# Patient Record
Sex: Female | Born: 1955 | Race: Black or African American | Hispanic: No | Smoking: Never smoker
Health system: Southern US, Community
[De-identification: ages and names within clinical notes are randomized; demographics above are authoritative.]

## PROBLEM LIST (undated history)

## (undated) DIAGNOSIS — D649 Anemia, unspecified: Secondary | ICD-10-CM

## (undated) DIAGNOSIS — K219 Gastro-esophageal reflux disease without esophagitis: Secondary | ICD-10-CM

## (undated) DIAGNOSIS — I10 Essential (primary) hypertension: Secondary | ICD-10-CM

## (undated) DIAGNOSIS — E785 Hyperlipidemia, unspecified: Secondary | ICD-10-CM

## (undated) DIAGNOSIS — E663 Overweight: Secondary | ICD-10-CM

## (undated) DIAGNOSIS — E119 Type 2 diabetes mellitus without complications: Secondary | ICD-10-CM

## (undated) DIAGNOSIS — M1990 Unspecified osteoarthritis, unspecified site: Secondary | ICD-10-CM

## (undated) DIAGNOSIS — M549 Dorsalgia, unspecified: Secondary | ICD-10-CM

## (undated) HISTORY — DX: Overweight: E66.3

## (undated) HISTORY — DX: Unspecified osteoarthritis, unspecified site: M19.90

## (undated) HISTORY — DX: Hyperlipidemia, unspecified: E78.5

## (undated) HISTORY — DX: Gastro-esophageal reflux disease without esophagitis: K21.9

## (undated) HISTORY — DX: Anemia, unspecified: D64.9

## (undated) HISTORY — DX: Essential (primary) hypertension: I10

## (undated) HISTORY — DX: Type 2 diabetes mellitus without complications: E11.9

## (undated) HISTORY — DX: Dorsalgia, unspecified: M54.9

## (undated) HISTORY — PX: BREAST BIOPSY: SHX20

---

## 1999-01-28 ENCOUNTER — Inpatient Hospital Stay (HOSPITAL_COMMUNITY): Admission: EM | Admit: 1999-01-28 | Discharge: 1999-01-29 | Payer: Self-pay

## 1999-01-28 ENCOUNTER — Encounter: Payer: Self-pay | Admitting: Emergency Medicine

## 2001-01-01 ENCOUNTER — Encounter: Payer: Self-pay | Admitting: Emergency Medicine

## 2001-01-01 ENCOUNTER — Emergency Department (HOSPITAL_COMMUNITY): Admission: EM | Admit: 2001-01-01 | Discharge: 2001-01-01 | Payer: Self-pay | Admitting: Emergency Medicine

## 2003-06-14 ENCOUNTER — Encounter: Admission: RE | Admit: 2003-06-14 | Discharge: 2003-09-12 | Payer: Self-pay | Admitting: *Deleted

## 2006-10-19 ENCOUNTER — Emergency Department (HOSPITAL_COMMUNITY): Admission: EM | Admit: 2006-10-19 | Discharge: 2006-10-19 | Payer: Self-pay | Admitting: Emergency Medicine

## 2010-01-13 LAB — HM MAMMOGRAPHY: HM Mammogram: NORMAL

## 2011-04-19 ENCOUNTER — Emergency Department (HOSPITAL_COMMUNITY)
Admission: EM | Admit: 2011-04-19 | Discharge: 2011-04-19 | Disposition: A | Discharge status: Home / Self Care | Payer: BC Managed Care – PPO | Attending: Emergency Medicine | Admitting: Emergency Medicine

## 2011-04-19 ENCOUNTER — Emergency Department (HOSPITAL_COMMUNITY): Payer: BC Managed Care – PPO

## 2011-04-19 DIAGNOSIS — M51379 Other intervertebral disc degeneration, lumbosacral region without mention of lumbar back pain or lower extremity pain: Secondary | ICD-10-CM | POA: Insufficient documentation

## 2011-04-19 DIAGNOSIS — R209 Unspecified disturbances of skin sensation: Secondary | ICD-10-CM | POA: Insufficient documentation

## 2011-04-19 DIAGNOSIS — M5137 Other intervertebral disc degeneration, lumbosacral region: Secondary | ICD-10-CM | POA: Insufficient documentation

## 2011-04-19 DIAGNOSIS — M5430 Sciatica, unspecified side: Secondary | ICD-10-CM | POA: Insufficient documentation

## 2011-04-19 DIAGNOSIS — M545 Low back pain, unspecified: Secondary | ICD-10-CM | POA: Insufficient documentation

## 2011-08-10 ENCOUNTER — Encounter: Payer: Self-pay | Admitting: Internal Medicine

## 2011-08-10 ENCOUNTER — Ambulatory Visit (INDEPENDENT_AMBULATORY_CARE_PROVIDER_SITE_OTHER): Payer: BC Managed Care – PPO | Admitting: Internal Medicine

## 2011-08-10 ENCOUNTER — Other Ambulatory Visit: Payer: Self-pay | Admitting: Internal Medicine

## 2011-08-10 ENCOUNTER — Other Ambulatory Visit (INDEPENDENT_AMBULATORY_CARE_PROVIDER_SITE_OTHER): Payer: BC Managed Care – PPO

## 2011-08-10 VITALS — BP 140/90 | HR 76 | Temp 99.0°F | Ht 66.0 in | Wt 185.8 lb

## 2011-08-10 DIAGNOSIS — Z1239 Encounter for other screening for malignant neoplasm of breast: Secondary | ICD-10-CM

## 2011-08-10 DIAGNOSIS — Z0000 Encounter for general adult medical examination without abnormal findings: Secondary | ICD-10-CM

## 2011-08-10 DIAGNOSIS — M549 Dorsalgia, unspecified: Secondary | ICD-10-CM

## 2011-08-10 DIAGNOSIS — Z124 Encounter for screening for malignant neoplasm of cervix: Secondary | ICD-10-CM

## 2011-08-10 LAB — BASIC METABOLIC PANEL
BUN: 12 mg/dL (ref 6–23)
CO2: 26 mEq/L (ref 19–32)
Calcium: 9.7 mg/dL (ref 8.4–10.5)
GFR: 87.98 mL/min (ref >60.00)
Glucose, Bld: 104 mg/dL — ABNORMAL HIGH (ref 70–99)

## 2011-08-10 LAB — URINALYSIS, ROUTINE W REFLEX MICROSCOPIC
Bilirubin Urine: NEGATIVE
Ketones, ur: NEGATIVE
Nitrite: NEGATIVE
Urobilinogen, UA: 0.2 (ref 0.0–1.0)
pH: 6 (ref 5.0–8.0)

## 2011-08-10 LAB — CBC WITH DIFFERENTIAL/PLATELET
Basophils Absolute: 0 10*3/uL (ref 0.0–0.1)
Eosinophils Absolute: 0.1 10*3/uL (ref 0.0–0.7)
Lymphocytes Relative: 36.7 % (ref 12.0–46.0)
Lymphs Abs: 1.8 10*3/uL (ref 0.7–4.0)
MCHC: 33.6 g/dL (ref 30.0–36.0)
Monocytes Relative: 5 % (ref 3.0–12.0)
Neutro Abs: 2.7 10*3/uL (ref 1.4–7.7)
Platelets: 224 10*3/uL (ref 150.0–400.0)
RDW: 14.2 % (ref 11.5–14.6)

## 2011-08-10 LAB — LIPID PANEL: Total CHOL/HDL Ratio: 4

## 2011-08-10 LAB — HEPATIC FUNCTION PANEL
ALT: 21 U/L (ref 0–35)
AST: 24 U/L (ref 0–37)
Alkaline Phosphatase: 65 U/L (ref 39–117)
Bilirubin, Direct: 0.1 mg/dL (ref 0.0–0.3)
Total Bilirubin: 0.6 mg/dL (ref 0.3–1.2)

## 2011-08-10 NOTE — Patient Instructions (Addendum)
It was good to see you today. We have reviewed your prior medical history today - Will send for records from Dr. Derrell Lolling to review and incorporate into our files here we'll make referral to physical therapy for your back pain, mammogram screening and to gynecology for Pap smear and hot flashes. Our office will contact you regarding appointment(s) once made. Test(s) ordered today. Your results will be called to you after review (48-72hours after test completion). If any changes need to be made, you will be notified at that time. Remember to eat enough calories during the day -you need at least 1500 kcal each day and these calories should be spread out throughout the day with protein snacks and other small meals as we discussed Please schedule followup in 6 months for continued review and followup, call sooner if problems.

## 2011-08-10 NOTE — Assessment & Plan Note (Signed)
Intermittent hx same since 2008 Has worked with [facility] ortho for same - also ESI x 1 fall 2012 but declines repeat ESI due to feared complication risk (infx) No neuro deficits on exam - continue current prn meds and refer to PT today

## 2011-08-10 NOTE — Progress Notes (Signed)
Subjective:    Patient ID: Alexis Shields, female    DOB: [birth year], 55 y.o.   MRN: [id number]  HPI  New patient to me and our practice, here today to establish care patient is here today for annual physical. Patient feels well and has no complaints.  Also reviewed chronic medical issues: Intermittent low back pain - left side with radiation into LLE - s/p single ESI 05/2011 - became fearful of complications (national infections due to steroid contamination) so declined follow up injections - has seen ortho ([facility]) and takes listed meds rarely prn - remote PT 1990s following workman's comp injury- would reconsider same  Past Medical History  Diagnosis Date  . Back pain   . Anemia   . Borderline diabetes 2009   Family History  Problem Relation Age of Onset  . Kidney failure Mother   . Alcohol abuse Father   . Liver disease Father     History  Substance Use Topics  . Smoking status: Never Smoker   . Smokeless tobacco: Not on file  . Alcohol Use: Yes    Review of Systems Constitutional: Negative for fever - ongoing weight gain since menopause status in past few years.  Respiratory: Negative for cough and shortness of breath.   Cardiovascular: Negative for chest pain or palpitations.  Gastrointestinal: Negative for abdominal pain, no bowel changes.  Musculoskeletal: Negative for gait problem or joint swelling.  Skin: Negative for rash.  Neurological: Negative for dizziness or headache.  No other specific complaints in a complete review of systems (except as listed in HPI above).     Objective:   Physical Exam Blood pressure 140/90, pulse 76, temperature 99 F (37.2 C), temperature source Oral, height 5\' 6"  (1.676 m), weight 185 lb 12.8 oz (84.278 kg), SpO2 97.00%. Wt Readings from Last 3 Encounters:  08/10/11 185 lb 12.8 oz (84.278 kg)   Constitutional: She appears well-developed and well-nourished. No distress.  HENT: Head: Normocephalic and atraumatic. Ears: B  TMs ok, no erythema or effusion; Nose: Nose normal.  Mouth/Throat: Oropharynx is clear and moist. No oropharyngeal exudate.  Eyes: Conjunctivae and EOM are normal. Pupils are equal, round, and reactive to light. No scleral icterus.  Neck: Normal range of motion. Neck supple. No JVD present. No thyromegaly present.  Cardiovascular: Normal rate, regular rhythm and normal heart sounds.  No murmur heard. No BLE edema. Pulmonary/Chest: Effort normal and breath sounds normal. No respiratory distress. She has no wheezes.  Abdominal: Soft. Bowel sounds are normal. She exhibits no distension. There is no tenderness. no masses Musculoskeletal: Back: full range of motion of thoracic and lumbar spine. Non tender to palpation. Negative straight leg raise. DTR's are symmetrically intact. Sensation intact in all dermatomes of the lower extremities. Full strength to manual muscle testing. patient is able to heel toe walk without difficulty and ambulates with normal gait. Normal range of motion, no joint effusions. No gross deformities Neurological: She is alert and oriented to person, place, and time. No cranial nerve deficit. Coordination normal.  Skin: Skin is warm and dry. No rash noted. No erythema.  Psychiatric: She has a normal mood and affect. Her behavior is normal. Judgment and thought content normal.   No results found for this basename: WBC,  HGB,  HCT,  PLT,  GLUCOSE,  CHOL,  TRIG,  HDL,  LDLDIRECT,  LDLCALC,  ALT,  AST,  NA,  K,  CL,  CREATININE,  BUN,  CO2,  TSH,  PSA,  INR,  GLUF,  HGBA1C,  MICROALBUR       Assessment & Plan:  CPX - v70.0 - Patient has been counseled on age-appropriate routine health concerns for screening and prevention. These are reviewed and up-to-date. Immunizations are up-to-date or declined. Labs ordered and will be reviewed. Send for prior records to confirm colonoscopy and immunization status. Refer for mammogram and GYN exam  Also See problem list. Medications and labs  reviewed today.

## 2011-08-11 ENCOUNTER — Other Ambulatory Visit: Payer: Self-pay | Admitting: Internal Medicine

## 2011-08-11 DIAGNOSIS — E669 Obesity, unspecified: Secondary | ICD-10-CM

## 2011-08-24 ENCOUNTER — Encounter: Payer: Self-pay | Admitting: *Deleted

## 2011-08-24 ENCOUNTER — Encounter: Payer: BC Managed Care – PPO | Attending: Internal Medicine | Admitting: *Deleted

## 2011-08-24 DIAGNOSIS — E669 Obesity, unspecified: Secondary | ICD-10-CM | POA: Insufficient documentation

## 2011-08-24 DIAGNOSIS — Z713 Dietary counseling and surveillance: Secondary | ICD-10-CM | POA: Insufficient documentation

## 2011-08-24 NOTE — Patient Instructions (Addendum)
Goals:  Count carbohydrates at meals as discussed.   Eat 3 meals/day + 1 snack (if desired) - Avoid meal skipping.   Choose more whole grains, lean protein, low-fat dairy (as tolerated), and fruits/non-starchy vegetables.   Increase protein rich foods at all meals and snacks.  Limit carbohydrate to 3 servings (45 grams) per meal.   Aim for 30 min of physical activity 2-3 days a week. Gradually increase to 45 min as able.   Avoid sugar-sweetened beverages and concentrated sweets; (try the 7.5 oz cans of Mr Wyn Quaker or 1/2 and 1/2)

## 2011-08-24 NOTE — Progress Notes (Signed)
Medical Nutrition Therapy:  Appt start time: 1100 end time:  1200.  Assessment:  Obesity/overweight. Pt here for assessment and nutritional counseling for "bad eating habits". Pt works as a Advertising copywriter, skips breakfast often, and drinks regular soft drinks or coffee with ~10 packs of sugar daily.  Consumes 3 meals some days and then "can't stop snacking" the next day.  Reports eating more when husband is in town and she cooks at home, though excessive CHO intake noted.  Pt reports a h/o anorexia nervosa with episodes of hypoglycemia prior to onset of menopause; exact timeframe unclear.  States she has gone 2 days without eating to lose weight and is very frustrated with lack of success.  No structured exercise noted outside of a 5 mile walk every other Tuesday and PT for back 2d/wk.  Agrees she could try adding walks in on her days off (Mon and Fri) or will look into joining to [facility] and swim.  No pain reported at this time.  MEDICATIONS: None   DIETARY INTAKE:  Usual eating pattern includes 0-3 meals and 0 snacks per day.    24-hr recall:  B ( AM): none; 1 c coffee or Mtn Dew  Snk ( AM): none  L (2 PM): Bologna w/ pepper jack cheese sandwich; pepsi (12 oz)  Snk ( PM): bottle water D ( PM): Ribs (2-3 bites of each), creamed potatoes (2 Tbsp); water and coffee (w/ 10 pks reg sugar) Snk ( PM): none  Usual physical activity: 5 miles every other Tuesday; PT for back pain 2d/wk  Estimated energy needs: 1100-1200 calories (maintenance: 1450 cal/day with no PA) 135-150 g carbohydrates  Progress Towards Goal(s):  In progress.   Nutritional Diagnosis:  [place]-3.3 Overweight related to menopause and excessive CHO intake as evidenced by patient reported weight gain of 40 lbs since onset of menopause 3 years ago and reported CHO intake.  Intervention/Goals:  Count carbohydrates at meals as discussed.   Eat 3 meals/day + 1 snack (if desired) - Avoid meal skipping.   Choose more whole grains, lean  protein, low-fat dairy (as tolerated), and fruits/non-starchy vegetables.   Increase protein rich foods at all meals and snacks.  Limit carbohydrate to 3 servings (45 grams) per meal.   Aim for 30 min of physical activity 2-3 days a week. Gradually increase to 45 min as able.   Avoid sugar-sweetened beverages and concentrated sweets; (try the 7.5 oz cans of Mr Wyn Quaker or 1/2 and 1/2)   Handouts given during visit include:  [employer] Guide  45g CHO menu/snack list  Monitoring/Evaluation:  Dietary intake, exercise, and body weight in 4 week(s).

## 2011-08-28 ENCOUNTER — Ambulatory Visit
Admission: RE | Admit: 2011-08-28 | Discharge: 2011-08-28 | Disposition: A | Discharge status: Home / Self Care | Payer: BC Managed Care – PPO | Source: Ambulatory Visit | Attending: Internal Medicine | Admitting: Internal Medicine

## 2011-08-28 DIAGNOSIS — Z1239 Encounter for other screening for malignant neoplasm of breast: Secondary | ICD-10-CM

## 2011-09-10 ENCOUNTER — Other Ambulatory Visit: Payer: Self-pay | Admitting: Internal Medicine

## 2011-09-10 DIAGNOSIS — R928 Other abnormal and inconclusive findings on diagnostic imaging of breast: Secondary | ICD-10-CM

## 2011-09-17 ENCOUNTER — Ambulatory Visit
Admission: RE | Admit: 2011-09-17 | Discharge: 2011-09-17 | Disposition: A | Discharge status: Home / Self Care | Payer: BC Managed Care – PPO | Source: Ambulatory Visit | Attending: Internal Medicine | Admitting: Internal Medicine

## 2011-09-17 DIAGNOSIS — R928 Other abnormal and inconclusive findings on diagnostic imaging of breast: Secondary | ICD-10-CM

## 2011-09-25 ENCOUNTER — Ambulatory Visit: Payer: BC Managed Care – PPO | Admitting: *Deleted

## 2011-09-28 ENCOUNTER — Encounter: Payer: BC Managed Care – PPO | Attending: Internal Medicine | Admitting: *Deleted

## 2011-09-28 ENCOUNTER — Encounter: Payer: Self-pay | Admitting: *Deleted

## 2011-09-28 DIAGNOSIS — E669 Obesity, unspecified: Secondary | ICD-10-CM | POA: Insufficient documentation

## 2011-09-28 DIAGNOSIS — Z713 Dietary counseling and surveillance: Secondary | ICD-10-CM | POA: Insufficient documentation

## 2011-09-28 NOTE — Patient Instructions (Addendum)
Goals:  Eat 3 meals/day + 1 snack (if desired) - Avoid meal skipping. Try to retrain your appetite with supplemental drinks (only short term).  Continue to count carbohydrates at meals as discussed (45 grams per meal).  Aim for 30 min of physical activity 2-3 days a week. Gradually increase to 45 min as able.   Continue to avoid sugar-sweetened beverages and concentrated sweets.  Consider trying [employer] as recommended by Ob-Gyn.   [facility] - [phone], [place]

## 2011-09-28 NOTE — Progress Notes (Signed)
Medical Nutrition Therapy:  Appt start time: 10:30 end time:  1100.  Primary Concerns Today: Obesity/overweight - Follow up. Pt here for follow up.  States she gained 40 lbs over the last 3 years.  Was more active previously.  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  Eats boiled egg or nuts with drink. Still drinking one cup of coffee daily with regular sugar and creamer. Reports eating less than previous visit in attempt to lose weight. Discussed eating consistent meals and carbs to prevent progression of DM and to lose weight. Pt not very happy about having to eat more. No pain reported at this time.  MEDICATIONS:  Only PRN meds when needed.   DIETARY INTAKE:  Usual eating pattern includes 1-2 meals and 0 snacks per day.    24-hr recall:  B ( AM): Nuts or 1 c coffee or Mtn Dew (7.5 oz)  Snk ( AM): None  L (2 PM): Chicken breast (2-3 oz) w/ cheese; stewed okra, corn, tomatoes, turnips, steamed squash; Coke (12 oz)  Snk ( PM): bottle water D (6 PM): 4 strawberries, 6 grapes, and 1 banana Snk ( PM): none  Usual physical activity: None at this time  Estimated energy needs: 1100-1200 calories (maintenance: 1450 cal/day with no PA) 135-150 g carbohydrates  Progress Towards Goal(s):  Some progress; Continue.   Nutritional Diagnosis:  [place]-3.3 Overweight related to menopause and excessive CHO intake as evidenced by patient reported weight gain of 40 lbs since onset of menopause 3 years ago and reported dietary intake.  Intervention/Goals:  Continue to count carbohydrates at meals as discussed (45 grams per meal).  Eat 3 meals/day + 1 snack (if desired) - Avoid meal skipping.   Choose more whole grains, lean protein, low-fat dairy (as tolerated), and fruits/non-starchy vegetables.   Aim for 30 min of physical activity 2-3 days a week. Gradually increase to 45 min as able.   Continue to avoid sugar-sweetened beverages and concentrated  sweets.  Consider trying [facility] as recommended by Ob-Gyn.   Samples Dispensed:   CIB NAS: 2 pkts Lot #: [id number]; Exp: 03/03/12  CIB Reg: 2 pkts Lot #: [id number]; Exp: 04/20/12  Boost Reg: 2 bottles Lot #: [id number]; Exp: 07/11/12  Monitoring/Evaluation:  Dietary intake, exercise, and body weight in 4 week(s).

## 2011-09-29 NOTE — Progress Notes (Signed)
Medical Nutrition Therapy:  Appt start time: 1100 end time:  1200.  Assessment:  Obesity/overweight. Pt here for assessment and nutritional counseling for "bad eating habits". Pt works as a housekeeper, skips breakfast often, and drinks regular soft drinks or coffee with ~10 packs of sugar daily.  Consumes 3 meals some days and then "can't stop snacking" the next day.  Reports eating more when husband is in town and she cooks at home, though excessive CHO intake noted.  Pt reports a h/o anorexia nervosa with episodes of hypoglycemia prior to onset of menopause; exact timeframe unclear.  States she has gone 2 days without eating to lose weight and is very frustrated with lack of success.  No structured exercise noted outside of a 5 mile walk every other Tuesday and PT for back 2d/wk.  Agrees she could try adding walks in on her days off (Mon and Fri) or will look into joining to [employer] and swim.  No pain reported at this time.  MEDICATIONS: None   DIETARY INTAKE:  Usual eating pattern includes 0-3 meals and 0 snacks per day.    24-hr recall:  B ( AM): none; 1 c coffee or Mtn Dew  Snk ( AM): none  L (2 PM): Bologna w/ pepper jack cheese sandwich; pepsi (12 oz)  Snk ( PM): bottle water D ( PM): Ribs (2-3 bites of each), creamed potatoes (2 Tbsp); water and coffee (w/ 10 pks reg sugar) Snk ( PM): none  Usual physical activity: 5 miles every other Tuesday; PT for back pain 2d/wk  Estimated energy needs: 1100-1200 calories (maintenance: 1450 cal/day with no PA) 135-150 g carbohydrates  Progress Towards Goal(s):  In progress.   Nutritional Diagnosis:  [place]-3.3 Overweight related to menopause and excessive CHO intake as evidenced by patient reported weight gain of 40 lbs since onset of menopause 3 years ago and reported CHO intake.  Intervention/Goals:  Count carbohydrates at meals as discussed.   Eat 3 meals/day + 1 snack (if desired) - Avoid meal skipping.   Choose more whole grains, lean  protein, low-fat dairy (as tolerated), and fruits/non-starchy vegetables.   Increase protein rich foods at all meals and snacks.  Limit carbohydrate to 3 servings (45 grams) per meal.   Aim for 30 min of physical activity 2-3 days a week. Gradually increase to 45 min as able.   Avoid sugar-sweetened beverages and concentrated sweets; (try the 7.5 oz cans of Mr Dew or 1/2 and 1/2)   Handouts given during visit include:  Supermarket Guide  45g CHO menu/snack list  Monitoring/Evaluation:  Dietary intake, exercise, and body weight in 4 week(s).  

## 2011-10-26 ENCOUNTER — Ambulatory Visit: Payer: BC Managed Care – PPO | Admitting: *Deleted

## 2012-02-08 ENCOUNTER — Ambulatory Visit: Payer: BC Managed Care – PPO | Admitting: Internal Medicine

## 2012-02-22 ENCOUNTER — Ambulatory Visit (INDEPENDENT_AMBULATORY_CARE_PROVIDER_SITE_OTHER): Payer: BC Managed Care – PPO | Admitting: Internal Medicine

## 2012-02-22 ENCOUNTER — Telehealth: Payer: Self-pay | Admitting: *Deleted

## 2012-02-22 ENCOUNTER — Encounter: Payer: Self-pay | Admitting: Internal Medicine

## 2012-02-22 VITALS — BP 118/82 | HR 70 | Temp 98.6°F | Ht 66.0 in | Wt 184.0 lb

## 2012-02-22 DIAGNOSIS — R7303 Prediabetes: Secondary | ICD-10-CM

## 2012-02-22 DIAGNOSIS — R7309 Other abnormal glucose: Secondary | ICD-10-CM

## 2012-02-22 DIAGNOSIS — Z6825 Body mass index (BMI) 25.0-25.9, adult: Secondary | ICD-10-CM

## 2012-02-22 DIAGNOSIS — K219 Gastro-esophageal reflux disease without esophagitis: Secondary | ICD-10-CM | POA: Insufficient documentation

## 2012-02-22 DIAGNOSIS — E119 Type 2 diabetes mellitus without complications: Secondary | ICD-10-CM | POA: Insufficient documentation

## 2012-02-22 DIAGNOSIS — Z0000 Encounter for general adult medical examination without abnormal findings: Secondary | ICD-10-CM

## 2012-02-22 DIAGNOSIS — E663 Overweight: Secondary | ICD-10-CM | POA: Insufficient documentation

## 2012-02-22 DIAGNOSIS — N951 Menopausal and female climacteric states: Secondary | ICD-10-CM

## 2012-02-22 MED ORDER — PHENTERMINE HCL 37.5 MG PO CAPS: 37.5000 mg | ORAL_CAPSULE | ORAL | Status: DC

## 2012-02-22 MED ORDER — BLACK COHOSH 40 MG PO CAPS: 40.0000 mg | ORAL_CAPSULE | Freq: Two times a day (BID) | ORAL | Status: DC

## 2012-02-22 NOTE — Assessment & Plan Note (Signed)
No results found for this basename: HGBA1C   Dx same 2009 -  The patient is asked to make an attempt to improve diet and exercise patterns to aid in medical management of this problem. Check labs annually  

## 2012-02-22 NOTE — Telephone Encounter (Signed)
Received staff msg pt mad cpx for December need labs in epic... 02/22/12@12 :10pm/LMB

## 2012-02-22 NOTE — Assessment & Plan Note (Signed)
S/p gyn eval 12/12 for same Reluctant to take meds for same recommended otc herbals - pt still unsure Support offered

## 2012-02-22 NOTE — Assessment & Plan Note (Signed)
Wt Readings from Last 3 Encounters:  02/22/12 184 lb (83.462 kg)  09/28/11 186 lb 14.4 oz (84.777 kg)  08/24/11 185 lb 9.6 oz (84.188 kg)   S/p nutritionist eval 12/12, 1/13 - frustrated by inability to change weight Trial phentermine - rx done

## 2012-02-22 NOTE — Telephone Encounter (Signed)
Message copied by Deatra James on Mon Feb 22, 2012 12:08 PM ------      Message from: COUSIN, SHARON T      Created: Mon Feb 22, 2012 11:16 AM      Regarding: PHY DATE  08/25/12       THANKS

## 2012-02-22 NOTE — Progress Notes (Signed)
  Subjective:    Patient ID: Alexis Shields, female    DOB: September 03, 1956, 56 y.o.   MRN: [id number]  HPI  Here for follow up -  reviewed chronic medical issues:  Obesity - has meet with nutritionist spring 2013 - still frustrated by inability to lose weight -  Intermittent low back pain - left side with radiation into LLE - s/p single ESI 05/2011 - became fearful of complications (national infections due to steroid contamination) so declined follow up injections - has seen ortho ([facility]) and takes listed meds rarely prn - remote PT 1990s following workman's comp injury, s/p repeat PT 08/2011 and feels improved  Hot flashes - has been seen by gyn for same - pt reluctant to take meds for same - afraid of side effects    Past Medical History  Diagnosis Date  . Back pain   . Anemia   . Borderline diabetes 2009  . Anorexia nervosa     Reported by patient at 08/24/11 visit  . Overweight (BMI 25.0-29.9)   . GERD (gastroesophageal reflux disease)     Review of Systems  Constitutional: Negative for fever - fustrated by weight gain since menopause status in past few years.  Respiratory: Negative for cough and shortness of breath.   Cardiovascular: Negative for chest pain or palpitations.       Objective:   Physical Exam  Blood pressure 118/82, pulse 70, temperature 98.6 F (37 C), temperature source Oral, height 5\' 6"  (1.676 m), weight 184 lb (83.462 kg), SpO2 96.00%. Wt Readings from Last 3 Encounters:  02/22/12 184 lb (83.462 kg)  09/28/11 186 lb 14.4 oz (84.777 kg)  08/24/11 185 lb 9.6 oz (84.188 kg)   Constitutional: She is overweight, but appears well-developed and well-nourished. No distress.  Neck: Normal range of motion. Neck supple. No JVD present. No thyromegaly present.  Cardiovascular: Normal rate, regular rhythm and normal heart sounds.  No murmur heard. No BLE edema. Pulmonary/Chest: Effort normal and breath sounds normal. No respiratory distress. She has no  wheezes.  Neurological: She is alert and oriented to person, place, and time. No cranial nerve deficit. Coordination normal.   Psychiatric: She has a normal mood and affect. Her behavior is normal. Judgment and thought content normal.   Lab Results  Component Value Date   WBC 4.8 08/10/2011   HGB 12.6 08/10/2011   HCT 37.5 08/10/2011   PLT 224.0 08/10/2011   GLUCOSE 104* 08/10/2011   CHOL 215* 08/10/2011   TRIG 44.0 08/10/2011   HDL 60.70 08/10/2011   LDLDIRECT 143.9 08/10/2011   ALT 21 08/10/2011   AST 24 08/10/2011   NA 142 08/10/2011   K 4.5 08/10/2011   CL 107 08/10/2011   CREATININE 0.9 08/10/2011   BUN 12 08/10/2011   CO2 26 08/10/2011   TSH 1.18 08/10/2011       Assessment & Plan:   See problem list. Medications and labs reviewed today.

## 2012-02-22 NOTE — Patient Instructions (Signed)
It was good to see you today. We have reviewed your prior medical history today - Try phentermine for appetite suppression - Your prescription(s) have been given to you to submit to your pharmacy. Please take as directed and contact our office if you believe you are having problem(s) with the medication(s). Please schedule followup in 6 months for physical and labs, call sooner if problems.

## 2012-05-16 ENCOUNTER — Encounter (HOSPITAL_COMMUNITY): Payer: Self-pay

## 2012-05-16 ENCOUNTER — Emergency Department (HOSPITAL_COMMUNITY): Payer: BC Managed Care – PPO

## 2012-05-16 ENCOUNTER — Emergency Department (HOSPITAL_COMMUNITY)
Admission: EM | Admit: 2012-05-16 | Discharge: 2012-05-17 | Disposition: A | Discharge status: Home / Self Care | Payer: BC Managed Care – PPO | Attending: Emergency Medicine | Admitting: Emergency Medicine

## 2012-05-16 DIAGNOSIS — K219 Gastro-esophageal reflux disease without esophagitis: Secondary | ICD-10-CM | POA: Insufficient documentation

## 2012-05-16 DIAGNOSIS — R51 Headache: Secondary | ICD-10-CM

## 2012-05-16 MED ORDER — SODIUM CHLORIDE 0.9 % IV BOLUS (SEPSIS)
1000.0000 mL | Freq: Once | INTRAVENOUS | Status: AC
  Administered 2012-05-16: 1000 mL via INTRAVENOUS

## 2012-05-16 MED ORDER — DIPHENHYDRAMINE HCL 50 MG/ML IJ SOLN
25.0000 mg | Freq: Once | INTRAMUSCULAR | Status: AC
  Administered 2012-05-16: 25 mg via INTRAVENOUS
  Filled 2012-05-16: qty 1

## 2012-05-16 MED ORDER — METOCLOPRAMIDE HCL 5 MG/ML IJ SOLN
10.0000 mg | Freq: Once | INTRAMUSCULAR | Status: AC
  Administered 2012-05-16: 10 mg via INTRAVENOUS
  Filled 2012-05-16: qty 2
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
## 2012-05-16 NOTE — ED Notes (Signed)
Pt states she awoke with a headache in both temples and the back of her head yesterday.  States she took a BC powder with relief for 2 hours-states the headache returned later in the day-states she awoke with it again this AM-took Baby aspirin x 2 tabs at 1600 this evening with no relief of pain.  States she saw her primary in June and states they checked her BP, but did not say anything about her BP being elevated.  States she is sound and light sensitive.

## 2012-05-16 NOTE — ED Notes (Signed)
PA at bedside.

## 2012-05-17 LAB — CBC WITH DIFFERENTIAL/PLATELET
Eosinophils Relative: 2 % (ref 0–5)
HCT: 37 % (ref 36.0–46.0)
Lymphocytes Relative: 42 % (ref 12–46)
Lymphs Abs: 2.1 10*3/uL (ref 0.7–4.0)
MCV: 80.1 fL (ref 78.0–100.0)
Monocytes Absolute: 0.4 10*3/uL (ref 0.1–1.0)
Platelets: 239 10*3/uL (ref 150–400)
RBC: 4.62 MIL/uL (ref 3.87–5.11)
WBC: 5 10*3/uL (ref 4.0–10.5)

## 2012-05-17 LAB — POCT I-STAT, CHEM 8
BUN: 9 mg/dL (ref 6–23)
Chloride: 106 mEq/L (ref 96–112)
Creatinine, Ser: 0.8 mg/dL (ref 0.50–1.10)
Potassium: 3.7 mEq/L (ref 3.5–5.1)
Sodium: 139 mEq/L (ref 135–145)
TCO2: 22 mmol/L (ref 0–100)

## 2012-05-17 LAB — BASIC METABOLIC PANEL
CO2: 24 mEq/L (ref 19–32)
Calcium: 9.6 mg/dL (ref 8.4–10.5)
Glucose, Bld: 109 mg/dL — ABNORMAL HIGH (ref 70–99)
Sodium: 136 mEq/L (ref 135–145)

## 2012-05-17 MED ORDER — HYDROCODONE-ACETAMINOPHEN 5-500 MG PO TABS: 1.0000 | ORAL_TABLET | Freq: Four times a day (QID) | ORAL | Status: DC | PRN

## 2012-05-17 NOTE — ED Provider Notes (Signed)
Medical screening examination/treatment/procedure(s) were performed by non-physician practitioner and as supervising physician I was immediately available for consultation/collaboration.    Nelia Shi, MD 05/17/12 [phone]

## 2012-05-18 ENCOUNTER — Ambulatory Visit (INDEPENDENT_AMBULATORY_CARE_PROVIDER_SITE_OTHER): Payer: BC Managed Care – PPO | Admitting: Internal Medicine

## 2012-05-18 ENCOUNTER — Telehealth: Payer: Self-pay | Admitting: Internal Medicine

## 2012-05-18 ENCOUNTER — Encounter: Payer: Self-pay | Admitting: Internal Medicine

## 2012-05-18 VITALS — BP 188/112 | HR 87 | Temp 98.8°F | Ht 67.0 in | Wt 187.0 lb

## 2012-05-18 DIAGNOSIS — R51 Headache: Secondary | ICD-10-CM

## 2012-05-18 DIAGNOSIS — I10 Essential (primary) hypertension: Secondary | ICD-10-CM

## 2012-05-18 DIAGNOSIS — F411 Generalized anxiety disorder: Secondary | ICD-10-CM

## 2012-05-18 DIAGNOSIS — I160 Hypertensive urgency: Secondary | ICD-10-CM

## 2012-05-18 DIAGNOSIS — F419 Anxiety disorder, unspecified: Secondary | ICD-10-CM | POA: Insufficient documentation

## 2012-05-18 MED ORDER — ALPRAZOLAM 0.5 MG PO TABS: 0.2500 mg | ORAL_TABLET | Freq: Three times a day (TID) | ORAL | Status: DC | PRN

## 2012-05-18 MED ORDER — LOSARTAN POTASSIUM 50 MG PO TABS: 50.0000 mg | ORAL_TABLET | Freq: Two times a day (BID) | ORAL | Status: DC

## 2012-05-18 NOTE — Patient Instructions (Addendum)
It was good to see you today. We have reviewed your ER records including labs and tests today Start losartan one pill 2x/day everyday for blood pressure Use xanax (1/2 or 1 pill) as needed for nerves, stress, anxiety symptoms or sleep Your prescription(s) have been submitted to your pharmacy. Please take as directed and contact our office if you believe you are having problem(s) with the medication(s). Call if blood pressure is over 180/100 or headache gets worse Ok to use Vicodin for headache if needed Please schedule followup in 2-4 weeks, call sooner if problems. Anxiety and Panic Attacks Anxiety is your body's way of reacting to real danger or something you think is a danger. It may be fear or worry over a situation like losing your job. Sometimes the cause is not known. A panic attack is made up of physical signs like sweating, shaking, or chest pain. Anxiety and panic attacks may start suddenly. They may be strong. They may come at any time of day, even while sleeping. They may come at any time of life. Panic attacks are scary, but they do not harm you physically.   HOME CARE  Avoid any known causes of your anxiety.   Try to relax. Yoga may help. Tell yourself everything will be okay.   Exercise often.   Get expert advice and help (therapy) to stop anxiety or attacks from happening.   Avoid caffeine, alcohol, and drugs.   Only take medicine as told by your doctor.  GET HELP RIGHT AWAY IF:  Your attacks seem different than normal attacks.   Your problems are getting worse or concern you.  MAKE SURE YOU:  Understand these instructions.   Will watch your condition.   Will get help right away if you are not doing well or get worse.  Document Released: 09/26/2010 Document Revised: 08/13/2011 Document Reviewed: 09/26/2010 [facility] Patient Information 2012 [place], [place]. Hypertension Hypertension is another name for high blood pressure. High blood pressure may mean that your  heart needs to work harder to pump blood. Blood pressure consists of two numbers, which includes a higher number over a lower number (example: 110/72). HOME CARE    Make lifestyle changes as told by your doctor. This may include weight loss and exercise.   Take your blood pressure medicine every day.   Limit how much salt you use.   Stop smoking if you smoke.   Do not use drugs.   Talk to your doctor if you are using decongestants or birth control pills. These medicines might make blood pressure higher.   Females should not drink more than 1 alcoholic drink per day. Males should not drink more than 2 alcoholic drinks per day.   See your doctor as told.  GET HELP RIGHT AWAY IF:    You have a blood pressure reading with a top number of 180 or higher.   You get a very bad headache.   You get blurred or changing vision.   You feel confused.   You feel weak, numb, or faint.   You get chest or belly (abdominal) pain.   You throw up (vomit).   You cannot breathe very well.  MAKE SURE YOU:    Understand these instructions.   Will watch your condition.   Will get help right away if you are not doing well or get worse.  Document Released: 02/10/2008 Document Revised: 08/13/2011 Document Reviewed: 02/10/2008 [facility] Patient Information 2012 [place], [place].

## 2012-05-18 NOTE — Telephone Encounter (Signed)
Patient Name: Alexis Shields; PCP: Rene Paci (Adults only); Best Callback Phone Number: [phone]; Patient reports seen at [facility] 05/16/12 , CT done for headache, BP was high: 202/113, prescription not given. BP at pharmacy 159/93 05/18/12. Guideline: Hypertension, at work now. Disposition: see within 24 hours due to sudden elevation in blood pressure. patient agrees to appointment 1615 05/18/12 with Dr Felicity Coyer.
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
## 2012-06-01 ENCOUNTER — Encounter: Payer: Self-pay | Admitting: Internal Medicine

## 2012-06-01 ENCOUNTER — Telehealth: Payer: Self-pay | Admitting: Internal Medicine

## 2012-06-01 ENCOUNTER — Ambulatory Visit (INDEPENDENT_AMBULATORY_CARE_PROVIDER_SITE_OTHER): Payer: BC Managed Care – PPO | Admitting: Internal Medicine

## 2012-06-01 VITALS — BP 162/102 | HR 82 | Temp 97.9°F

## 2012-06-01 DIAGNOSIS — R51 Headache: Secondary | ICD-10-CM

## 2012-06-01 DIAGNOSIS — R55 Syncope and collapse: Secondary | ICD-10-CM

## 2012-06-01 DIAGNOSIS — F411 Generalized anxiety disorder: Secondary | ICD-10-CM

## 2012-06-01 DIAGNOSIS — I160 Hypertensive urgency: Secondary | ICD-10-CM

## 2012-06-01 DIAGNOSIS — F419 Anxiety disorder, unspecified: Secondary | ICD-10-CM

## 2012-06-01 DIAGNOSIS — I10 Essential (primary) hypertension: Secondary | ICD-10-CM

## 2012-06-01 MED ORDER — HYDROCODONE-ACETAMINOPHEN 5-500 MG PO TABS: 1.0000 | ORAL_TABLET | Freq: Four times a day (QID) | ORAL | Status: DC | PRN

## 2012-06-01 MED ORDER — MEPERIDINE HCL 50 MG PO TABS: 50.0000 mg | ORAL_TABLET | Freq: Once | ORAL | Status: DC

## 2012-06-01 MED ORDER — MEPERIDINE HCL 25 MG/ML IJ SOLN
50.0000 mg | Freq: Once | INTRAMUSCULAR | Status: AC
  Administered 2012-06-01: 50 mg via INTRAVENOUS

## 2012-06-01 MED ORDER — PROMETHAZINE HCL 25 MG/ML IJ SOLN
25.0000 mg | Freq: Once | INTRAMUSCULAR | Status: AC
  Administered 2012-06-01: 25 mg via INTRAVENOUS

## 2012-06-01 MED ORDER — PROMETHAZINE HCL 25 MG/ML IJ SOLN: 25.0000 mg | Freq: Once | INTRAMUSCULAR | Status: DC

## 2012-06-01 MED ORDER — AMLODIPINE BESYLATE 10 MG PO TABS: 10.0000 mg | ORAL_TABLET | Freq: Every day | ORAL | Status: DC
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
## 2012-06-01 NOTE — Patient Instructions (Addendum)
It was good to see you today. Pain shot for headache given today - no driving for next 6 hours we'll make referral for MRI brain. Our office will contact you regarding appointment(s) once made. Start amlodipine one daily - start tonight - and continue losartan one pill 2x/day everyday - both are for blood pressure Use xanax (1/2 or 1 pill) as needed for nerves, stress, anxiety symptoms or sleep Your prescription(s) have been submitted to your pharmacy. Please take as directed and contact our office if you believe you are having problem(s) with the medication(s). Call if blood pressure is over 180/100 or headache gets worse if your symptoms continue to worsen (pain, passing out, etc), or if you are unable take anything by mouth (pills, fluids, etc), you should go to the emergency room for further evaluation and treatment. Ok to use Vicodin for headache if needed - refill today Please keep scheduled followup, call sooner if problems. Anxiety and Panic Attacks Anxiety is your body's way of reacting to real danger or something you think is a danger. It may be fear or worry over a situation like losing your job. Sometimes the cause is not known. A panic attack is made up of physical signs like sweating, shaking, or chest pain. Anxiety and panic attacks may start suddenly. They may be strong. They may come at any time of day, even while sleeping. They may come at any time of life. Panic attacks are scary, but they do not harm you physically.   HOME CARE  Avoid any known causes of your anxiety.   Try to relax. Yoga may help. Tell yourself everything will be okay.   Exercise often.   Get expert advice and help (therapy) to stop anxiety or attacks from happening.   Avoid caffeine, alcohol, and drugs.   Only take medicine as told by your doctor.  GET HELP RIGHT AWAY IF:  Your attacks seem different than normal attacks.   Your problems are getting worse or concern you.  MAKE SURE  YOU:  Understand these instructions.   Will watch your condition.   Will get help right away if you are not doing well or get worse.  Document Released: 09/26/2010 Document Revised: 08/13/2011 Document Reviewed: 09/26/2010 [facility] Patient Information 2012 [place], [place]. Hypertension Hypertension is another name for high blood pressure. High blood pressure may mean that your heart needs to work harder to pump blood. Blood pressure consists of two numbers, which includes a higher number over a lower number (example: 110/72). HOME CARE    Make lifestyle changes as told by your doctor. This may include weight loss and exercise.   Take your blood pressure medicine every day.   Limit how much salt you use.   Stop smoking if you smoke.   Do not use drugs.   Talk to your doctor if you are using decongestants or birth control pills. These medicines might make blood pressure higher.   Females should not drink more than 1 alcoholic drink per day. Males should not drink more than 2 alcoholic drinks per day.   See your doctor as told.  GET HELP RIGHT AWAY IF:    You have a blood pressure reading with a top number of 180 or higher.   You get a very bad headache.   You get blurred or changing vision.   You feel confused.   You feel weak, numb, or faint.   You get chest or belly (abdominal) pain.   You throw up (  vomit).   You cannot breathe very well.  MAKE SURE YOU:    Understand these instructions.   Will watch your condition.   Will get help right away if you are not doing well or get worse.  Document Released: 02/10/2008 Document Revised: 08/13/2011 Document Reviewed: 02/10/2008 [facility] Patient Information 2012 [place], [place].

## 2012-06-01 NOTE — Telephone Encounter (Signed)
Caller: Candela/Patient; Patient Name: Alexis Shields; PCP: Rene Paci (Adults only); Best Callback Phone Number: [phone];  Call returned to patient. Patient states she is currently in the office at [facility]. Triage assessment not completed due to patient being in office.
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
## 2012-06-05 ENCOUNTER — Ambulatory Visit
Admission: RE | Admit: 2012-06-05 | Discharge: 2012-06-05 | Disposition: A | Discharge status: Home / Self Care | Payer: BC Managed Care – PPO | Source: Ambulatory Visit | Attending: Internal Medicine | Admitting: Internal Medicine

## 2012-06-05 DIAGNOSIS — R51 Headache: Secondary | ICD-10-CM

## 2012-06-05 DIAGNOSIS — I160 Hypertensive urgency: Secondary | ICD-10-CM

## 2012-06-05 DIAGNOSIS — R55 Syncope and collapse: Secondary | ICD-10-CM

## 2012-06-07 ENCOUNTER — Encounter: Payer: Self-pay | Admitting: Internal Medicine

## 2012-06-07 ENCOUNTER — Ambulatory Visit (INDEPENDENT_AMBULATORY_CARE_PROVIDER_SITE_OTHER): Payer: BC Managed Care – PPO | Admitting: Internal Medicine

## 2012-06-07 VITALS — BP 136/84 | HR 93 | Temp 98.1°F | Ht 66.0 in | Wt 183.0 lb

## 2012-06-07 DIAGNOSIS — N951 Menopausal and female climacteric states: Secondary | ICD-10-CM

## 2012-06-07 DIAGNOSIS — F419 Anxiety disorder, unspecified: Secondary | ICD-10-CM

## 2012-06-07 DIAGNOSIS — F411 Generalized anxiety disorder: Secondary | ICD-10-CM

## 2012-06-07 DIAGNOSIS — I10 Essential (primary) hypertension: Secondary | ICD-10-CM

## 2012-06-07 MED ORDER — VENLAFAXINE HCL ER 37.5 MG PO CP24: 37.5000 mg | ORAL_CAPSULE | Freq: Every day | ORAL | Status: DC

## 2012-06-07 NOTE — Patient Instructions (Addendum)
It was good to see you today. We have reviewed your prior records including labs and tests today  Continue only amlodipine one daily for blood pressure Stop the losartan  Continue to use xanax (1/2 or 1 pill) as needed for nerves, stress, anxiety symptoms or sleep Start low dose effexor for hot flash control - take everyday Your prescription(s) have been submitted to your pharmacy. Please take as directed and contact our office if you believe you are having problem(s) with the medication(s). Please followup in December as planned for blood work and for blood pressure and hot flash recheck, call sooner if problems.
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
## 2012-06-07 NOTE — Assessment & Plan Note (Signed)
S/p gyn eval 12/12 for same Reluctant to take meds for same due to breast cancer hx Prior use otc herbals ineffective (black cohash) Start low dose venlafaxine - pt agrees to same Support offered

## 2012-06-07 NOTE — Assessment & Plan Note (Signed)
  Reviewed family stress/anxiety again today  Continue low dose Xanax prn panic attacks - encouraged compliance with same. Extensive emotional support provided today

## 2012-06-07 NOTE — Assessment & Plan Note (Signed)
BP Readings from Last 3 Encounters:  06/07/12 136/84  06/01/12 162/102  05/18/12 188/112    hypertension urgency events reviewed - precipitated by stress late summer 2013 added amlodipine to ARB last week, but misunderstanding - taking only amlodpine - ok to continue same recent head CT and MRI reviewed> negative The current medical regimen is effective;  continue present plan and medications.

## 2012-06-25 ENCOUNTER — Other Ambulatory Visit: Payer: Self-pay | Admitting: Family Medicine

## 2012-06-25 NOTE — Telephone Encounter (Signed)
Please pull paper chart.  

## 2012-06-25 NOTE — Telephone Encounter (Signed)
Chart pulled to PA pool at nurses station [phone]

## 2012-08-08 ENCOUNTER — Other Ambulatory Visit: Payer: Self-pay | Admitting: Internal Medicine

## 2012-08-08 DIAGNOSIS — Z1231 Encounter for screening mammogram for malignant neoplasm of breast: Secondary | ICD-10-CM

## 2012-08-25 ENCOUNTER — Encounter: Payer: BC Managed Care – PPO | Admitting: Internal Medicine

## 2012-08-29 ENCOUNTER — Ambulatory Visit (INDEPENDENT_AMBULATORY_CARE_PROVIDER_SITE_OTHER): Payer: BC Managed Care – PPO | Admitting: Internal Medicine

## 2012-08-29 ENCOUNTER — Other Ambulatory Visit (INDEPENDENT_AMBULATORY_CARE_PROVIDER_SITE_OTHER): Payer: BC Managed Care – PPO

## 2012-08-29 ENCOUNTER — Telehealth: Payer: Self-pay | Admitting: Internal Medicine

## 2012-08-29 ENCOUNTER — Encounter: Payer: Self-pay | Admitting: Internal Medicine

## 2012-08-29 VITALS — BP 128/90 | HR 81 | Temp 98.2°F | Ht 67.0 in | Wt 184.0 lb

## 2012-08-29 DIAGNOSIS — Z0000 Encounter for general adult medical examination without abnormal findings: Secondary | ICD-10-CM

## 2012-08-29 DIAGNOSIS — Z6825 Body mass index (BMI) 25.0-25.9, adult: Secondary | ICD-10-CM

## 2012-08-29 DIAGNOSIS — R7303 Prediabetes: Secondary | ICD-10-CM

## 2012-08-29 DIAGNOSIS — R7309 Other abnormal glucose: Secondary | ICD-10-CM

## 2012-08-29 DIAGNOSIS — I10 Essential (primary) hypertension: Secondary | ICD-10-CM

## 2012-08-29 DIAGNOSIS — E663 Overweight: Secondary | ICD-10-CM

## 2012-08-29 DIAGNOSIS — Z23 Encounter for immunization: Secondary | ICD-10-CM

## 2012-08-29 LAB — LIPID PANEL
Cholesterol: 213 mg/dL — ABNORMAL HIGH (ref 0–200)
HDL: 49.9 mg/dL (ref >39.00)
Total CHOL/HDL Ratio: 4
Triglycerides: 62 mg/dL (ref 0.0–149.0)
VLDL: 12.4 mg/dL (ref 0.0–40.0)

## 2012-08-29 LAB — URINALYSIS, ROUTINE W REFLEX MICROSCOPIC
Bilirubin Urine: NEGATIVE
Hgb urine dipstick: NEGATIVE
Nitrite: NEGATIVE
Total Protein, Urine: NEGATIVE
Urine Glucose: NEGATIVE
pH: 6 (ref 5.0–8.0)

## 2012-08-29 LAB — HEPATIC FUNCTION PANEL
ALT: 20 U/L (ref 0–35)
Alkaline Phosphatase: 70 U/L (ref 39–117)
Bilirubin, Direct: 0 mg/dL (ref 0.0–0.3)
Total Bilirubin: 0.4 mg/dL (ref 0.3–1.2)
Total Protein: 7.2 g/dL (ref 6.0–8.3)

## 2012-08-29 LAB — BASIC METABOLIC PANEL
CO2: 25 mEq/L (ref 19–32)
Chloride: 103 mEq/L (ref 96–112)
Sodium: 137 mEq/L (ref 135–145)

## 2012-08-29 LAB — CBC WITH DIFFERENTIAL/PLATELET
Basophils Absolute: 0 10*3/uL (ref 0.0–0.1)
Eosinophils Absolute: 0.1 10*3/uL (ref 0.0–0.7)
Lymphocytes Relative: 34.9 % (ref 12.0–46.0)
Monocytes Relative: 4.4 % (ref 3.0–12.0)
Neutrophils Relative %: 58.1 % (ref 43.0–77.0)
Platelets: 251 10*3/uL (ref 150.0–400.0)
RDW: 13.6 % (ref 11.5–14.6)

## 2012-08-29 LAB — HEMOGLOBIN A1C: Hgb A1c MFr Bld: 7 % — ABNORMAL HIGH (ref 4.6–6.5)

## 2012-08-29 LAB — LDL CHOLESTEROL, DIRECT: Direct LDL: 153.3 mg/dL

## 2012-08-29 MED ORDER — AMLODIPINE BESYLATE 10 MG PO TABS: 10.0000 mg | ORAL_TABLET | Freq: Every day | ORAL | Status: DC

## 2012-08-29 MED ORDER — METFORMIN HCL ER 500 MG PO TB24: 500.0000 mg | ORAL_TABLET | Freq: Every day | ORAL | Status: DC

## 2012-08-29 NOTE — Assessment & Plan Note (Signed)
Wt Readings from Last 3 Encounters:  08/29/12 184 lb (83.462 kg)  06/07/12 183 lb (83.008 kg)  05/18/12 187 lb (84.823 kg)   S/p nutritionist eval 12/12, 1/13 - frustrated by inability to change weight Trial phentermine 02/2012 without success

## 2012-08-29 NOTE — Addendum Note (Signed)
Addended by: Rene Paci A on: 08/29/2012 05:38 PM   Modules accepted: Orders

## 2012-08-29 NOTE — Patient Instructions (Addendum)
It was good to see you today. Health Maintenance reviewed - tetanus updated today - all other recommended immunizations and age-appropriate screenings are up-to-date. Test(s) ordered today. Your results will be released to MyChart (or called to you) after review, usually within 72hours after test completion. If any changes need to be made, you will be notified at that same time. Medications reviewed and updated, no changes at this time. Work on lifestyle changes as discussed (low fat, low carb, increased protein diet; improved exercise efforts; weight loss) to control sugar, blood pressure and cholesterol levels and/or reduce risk of developing other medical problems. Look into [email or web address] or other type of food journal to assist you in this process. Remember to eat enough calories during the day -you need at least 1500 kcal each day and these calories should be spread out throughout the day with protein snacks and other small meals as we discussed Please schedule followup in 6 months for blood pressure and weight check, call sooner if problems.  Health Maintenance, Females A healthy lifestyle and preventative care can promote health and wellness.  Maintain regular health, dental, and eye exams.   Eat a healthy diet. Foods like vegetables, fruits, whole grains, low-fat dairy products, and lean protein foods contain the nutrients you need without too many calories. Decrease your intake of foods high in solid fats, added sugars, and salt. Get information about a proper diet from your caregiver, if necessary.   Regular physical exercise is one of the most important things you can do for your health. Most adults should get at least 150 minutes of moderate-intensity exercise (any activity that increases your heart rate and causes you to sweat) each week. In addition, most adults need muscle-strengthening exercises on 2 or more days a week.     Maintain a healthy weight. The body mass index (BMI) is a  screening tool to identify possible weight problems. It provides an estimate of body fat based on height and weight. Your caregiver can help determine your BMI, and can help you achieve or maintain a healthy weight. For adults 20 years and older:   A BMI below 18.5 is considered underweight.   A BMI of 18.5 to 24.9 is normal.   A BMI of 25 to 29.9 is considered overweight.   A BMI of 30 and above is considered obese.   Maintain normal blood lipids and cholesterol by exercising and minimizing your intake of saturated fat. Eat a balanced diet with plenty of fruits and vegetables. Blood tests for lipids and cholesterol should begin at age 42 and be repeated every 5 years. If your lipid or cholesterol levels are high, you are over 50, or you are a high risk for heart disease, you may need your cholesterol levels checked more frequently. Ongoing high lipid and cholesterol levels should be treated with medicines if diet and exercise are not effective.   If you smoke, find out from your caregiver how to quit. If you do not use tobacco, do not start.   If you are pregnant, do not drink alcohol. If you are breastfeeding, be very cautious about drinking alcohol. If you are not pregnant and choose to drink alcohol, do not exceed 1 drink per day. One drink is considered to be 12 ounces (355 mL) of beer, 5 ounces (148 mL) of wine, or 1.5 ounces (44 mL) of liquor.   Avoid use of street drugs. Do not share needles with anyone. Ask for help if you need support or instructions  about stopping the use of drugs.   High blood pressure causes heart disease and increases the risk of stroke. Blood pressure should be checked at least every 1 to 2 years. Ongoing high blood pressure should be treated with medicines, if weight loss and exercise are not effective.   If you are 38 to 56 years old, ask your caregiver if you should take aspirin to prevent strokes.   Diabetes screening involves taking a blood sample to check  your fasting blood sugar level. This should be done once every 3 years, after age 73, if you are within normal weight and without risk factors for diabetes. Testing should be considered at a younger age or be carried out more frequently if you are overweight and have at least 1 risk factor for diabetes.   Breast cancer screening is essential preventative care for women. You should practice "breast self-awareness." This means understanding the normal appearance and feel of your breasts and may include breast self-examination. Any changes detected, no matter how small, should be reported to a caregiver. Women in their 37s and 30s should have a clinical breast exam (CBE) by a caregiver as part of a regular health exam every 1 to 3 years. After age 51, women should have a CBE every year. Starting at age 60, women should consider having a mammogram (breast X-ray) every year. Women who have a family history of breast cancer should talk to their caregiver about genetic screening. Women at a high risk of breast cancer should talk to their caregiver about having an MRI and a mammogram every year.   The Pap test is a screening test for cervical cancer. Women should have a Pap test starting at age 60. Between ages 27 and 47, Pap tests should be repeated every 2 years. Beginning at age 5, you should have a Pap test every 3 years as long as the past 3 Pap tests have been normal. If you had a hysterectomy for a problem that was not cancer or a condition that could lead to cancer, then you no longer need Pap tests. If you are between ages 50 and 53, and you have had normal Pap tests going back 10 years, you no longer need Pap tests. If you have had past treatment for cervical cancer or a condition that could lead to cancer, you need Pap tests and screening for cancer for at least 20 years after your treatment. If Pap tests have been discontinued, risk factors (such as a new sexual partner) need to be reassessed to determine  if screening should be resumed. Some women have medical problems that increase the chance of getting cervical cancer. In these cases, your caregiver may recommend more frequent screening and Pap tests.   The human papillomavirus (HPV) test is an additional test that may be used for cervical cancer screening. The HPV test looks for the virus that can cause the cell changes on the cervix. The cells collected during the Pap test can be tested for HPV. The HPV test could be used to screen women aged 75 years and older, and should be used in women of any age who have unclear Pap test results. After the age of 41, women should have HPV testing at the same frequency as a Pap test.   Colorectal cancer can be detected and often prevented. Most routine colorectal cancer screening begins at the age of 70 and continues through age 40. However, your caregiver may recommend screening at an earlier age if  you have risk factors for colon cancer. On a yearly basis, your caregiver may provide home test kits to check for hidden blood in the stool. Use of a small camera at the end of a tube, to directly examine the colon (sigmoidoscopy or colonoscopy), can detect the earliest forms of colorectal cancer. Talk to your caregiver about this at age 44, when routine screening begins. Direct examination of the colon should be repeated every 5 to 10 years through age 72, unless early forms of pre-cancerous polyps or small growths are found.   Hepatitis C blood testing is recommended for all people born from [birth year] through 1965 and any individual with known risks for hepatitis C.   Practice safe sex. Use condoms and avoid high-risk sexual practices to reduce the spread of sexually transmitted infections (STIs). Sexually active women aged 66 and younger should be checked for Chlamydia, which is a common sexually transmitted infection. Older women with new or multiple partners should also be tested for Chlamydia. Testing for other STIs is  recommended if you are sexually active and at increased risk.   Osteoporosis is a disease in which the bones lose minerals and strength with aging. This can result in serious bone fractures. The risk of osteoporosis can be identified using a bone density scan. Women ages 46 and over and women at risk for fractures or osteoporosis should discuss screening with their caregivers. Ask your caregiver whether you should be taking a calcium supplement or vitamin D to reduce the rate of osteoporosis.   Menopause can be associated with physical symptoms and risks. Hormone replacement therapy is available to decrease symptoms and risks. You should talk to your caregiver about whether hormone replacement therapy is right for you.   Use sunscreen with a sun protection factor (SPF) of 30 or greater. Apply sunscreen liberally and repeatedly throughout the day. You should seek shade when your shadow is shorter than you. Protect yourself by wearing long sleeves, pants, a wide-brimmed hat, and sunglasses year round, whenever you are outdoors.   Notify your caregiver of new moles or changes in moles, especially if there is a change in shape or color. Also notify your caregiver if a mole is larger than the size of a pencil eraser.   Stay current with your immunizations.  Document Released: 03/09/2011 Document Revised: 11/16/2011 Document Reviewed: 03/09/2011 [facility] Patient Information 2013 [place], [place].   Exercise to Lose Weight Exercise and a healthy diet may help you lose weight. Your doctor may suggest specific exercises. EXERCISE IDEAS AND TIPS  Choose low-cost things you enjoy doing, such as walking, bicycling, or exercising to workout videos.   Take stairs instead of the elevator.   Walk during your lunch break.   Park your car further away from work or school.   Go to a gym or an exercise class.   Start with 5 to 10 minutes of exercise each day. Build up to 30 minutes of exercise 4 to 6 days a  week.   Wear shoes with good support and comfortable clothes.   Stretch before and after working out.   Work out until you breathe harder and your heart beats faster.   Drink extra water when you exercise.   Do not do so much that you hurt yourself, feel dizzy, or get very short of breath.  Exercises that burn about 150 calories:  Running 1  miles in 15 minutes.   Playing volleyball for 45 to 60 minutes.   Washing and waxing  a car for 45 to 60 minutes.   Playing touch football for 45 minutes.   Walking 1  miles in 35 minutes.   Pushing a stroller 1  miles in 30 minutes.   Playing basketball for 30 minutes.   Raking leaves for 30 minutes.   Bicycling 5 miles in 30 minutes.   Walking 2 miles in 30 minutes.   Dancing for 30 minutes.   Shoveling snow for 15 minutes.   Swimming laps for 20 minutes.   Walking up stairs for 15 minutes.   Bicycling 4 miles in 15 minutes.   Gardening for 30 to 45 minutes.   Jumping rope for 15 minutes.   Washing windows or floors for 45 to 60 minutes.  Document Released: 09/26/2010 Document Revised: 11/16/2011 Document Reviewed: 09/26/2010 [facility] Patient Information 2013 [place], [place].

## 2012-08-29 NOTE — Assessment & Plan Note (Signed)
BP Readings from Last 3 Encounters:  08/29/12 128/90  06/07/12 136/84  06/01/12 162/102   hypertension urgency events reviewed - precipitated by stress late summer 2013 Changed ARB to amlodipine 05/2012 - doing well The current medical regimen is effective;  continue present plan and medications.

## 2012-08-29 NOTE — Progress Notes (Signed)
Subjective:    Patient ID: Alexis Shields, female    DOB: 1956/07/23, 56 y.o.   MRN: [id number]  HPI  patient is here today for annual physical. Patient feels well overall.  Also reviewed chronic medical issues: Intermittent low back pain - left side with radiation into LLE - s/p single ESI 05/2011 - became fearful of complications (national infections due to steroid contamination) so declined follow up injections - has seen ortho (giofree) and takes listed meds rarely prn - remote PT 1990s following workman's comp injury, repeated 2012 with good relief -  hypertension - med changes fall 2013 following near syncope event, prior ARB, now amlodipine - the patient reports compliance with medication(s) as prescribed. Denies adverse side effects.  Borderline diabetes mellitus - never on meds for same - inconsistent diet/exercise work to control same  Past Medical History  Diagnosis Date  . Back pain   . Anemia   . Borderline diabetes 2009  . Anorexia nervosa     Reported by patient at 08/24/11 visit  . Overweight (BMI 25.0-29.9)   . GERD (gastroesophageal reflux disease)   . Hypertension    Family History  Problem Relation Age of Onset  . Kidney failure Mother   . Alcohol abuse Father   . Liver disease Father    History  Substance Use Topics  . Smoking status: Never Smoker   . Smokeless tobacco: Not on file  . Alcohol Use: Yes     Comment: Socially - 1x/mo    Review of Systems  Constitutional: Negative for fever - ongoing weight gain since menopause.  Respiratory: Negative for cough and shortness of breath.   Cardiovascular: Negative for chest pain or palpitations.  Gastrointestinal: Negative for abdominal pain, no bowel changes.  Musculoskeletal: Negative for gait problem or joint swelling.  Skin: Negative for rash.  Neurological: Negative for dizziness or headache.  No other specific complaints in a complete review of systems (except as listed in HPI above).      Objective:   Physical Exam BP 128/90  Pulse 81  Temp 98.2 F (36.8 C) (Oral)  Ht 5\' 7"  (1.702 m)  Wt 184 lb (83.462 kg)  BMI 28.82 kg/m2  SpO2 98%  Wt Readings from Last 3 Encounters:  08/29/12 184 lb (83.462 kg)  06/07/12 183 lb (83.008 kg)  05/18/12 187 lb (84.823 kg)   Constitutional: She appears well-developed and well-nourished. No distress.  HENT: Head: Normocephalic and atraumatic. Ears: B TMs ok, no erythema or effusion; Nose: Nose normal. Mouth/Throat: Oropharynx is clear and moist. No oropharyngeal exudate.  Eyes: Conjunctivae and EOM are normal. Pupils are equal, round, and reactive to light. No scleral icterus.  Neck: Normal range of motion. Neck supple. No JVD present. No thyromegaly present.  Cardiovascular: Normal rate, regular rhythm and normal heart sounds.  No murmur heard. No BLE edema. Pulmonary/Chest: Effort normal and breath sounds normal. No respiratory distress. She has no wheezes.  Abdominal: Soft. Bowel sounds are normal. She exhibits no distension. There is no tenderness. no masses Musculoskeletal: Back: full range of motion of thoracic and lumbar spine. Non tender to palpation. Negative straight leg raise. DTR's are symmetrically intact. Sensation intact in all dermatomes of the lower extremities. Full strength to manual muscle testing. patient is able to heel toe walk without difficulty and ambulates with normal gait. Normal range of motion, no joint effusions. No gross deformities Neurological: She is alert and oriented to person, place, and time. No cranial nerve deficit. Coordination normal.  Skin: Skin is warm and dry. No rash noted. No erythema.  Psychiatric: She has a normal mood and affect. Her behavior is normal. Judgment and thought content normal.   Lab Results  Component Value Date   WBC 5.0 05/17/2012   HGB 12.6 05/17/2012   HCT 37.0 05/17/2012   PLT 239 05/17/2012   GLUCOSE 110* 05/17/2012   CHOL 215* 08/10/2011   TRIG 44.0 08/10/2011   HDL  60.70 08/10/2011   LDLDIRECT 143.9 08/10/2011   ALT 21 08/10/2011   AST 24 08/10/2011   NA 139 05/17/2012   K 3.7 05/17/2012   CL 106 05/17/2012   CREATININE 0.80 05/17/2012   BUN 9 05/17/2012   CO2 24 05/17/2012   TSH 1.18 08/10/2011   ECG: NSR @ 81 bpm - non specific T wave changes, unchanged    Assessment & Plan:  CPX - v70.0 - Patient has been counseled on age-appropriate routine health concerns for screening and prevention. These are reviewed and up-to-date. Immunizations are up-to-date or declined. Labs ordered and will be reviewed.  Borderline DM - check a1c - The patient is asked to make an attempt to improve diet and exercise patterns to aid in medical management of this problem.   Also See problem list. Medications and labs reviewed today.

## 2012-08-29 NOTE — Assessment & Plan Note (Signed)
No results found for this basename: HGBA1C   Dx same 2009 -  The patient is asked to make an attempt to improve diet and exercise patterns to aid in medical management of this problem. Check labs annually

## 2012-08-29 NOTE — Telephone Encounter (Signed)
Notified pt rx sent to [facility]mb 

## 2012-08-29 NOTE — Telephone Encounter (Signed)
Pt was just here for ov 08/29/12, pt forgot to ask for refill for Amlodipine 10mg . Please call pt once it done.

## 2012-09-12 ENCOUNTER — Ambulatory Visit
Admission: RE | Admit: 2012-09-12 | Discharge: 2012-09-12 | Disposition: A | Discharge status: Home / Self Care | Payer: BC Managed Care – PPO | Source: Ambulatory Visit | Attending: Internal Medicine | Admitting: Internal Medicine

## 2012-09-12 DIAGNOSIS — Z1231 Encounter for screening mammogram for malignant neoplasm of breast: Secondary | ICD-10-CM

## 2012-10-04 ENCOUNTER — Encounter: Payer: Self-pay | Admitting: *Deleted

## 2012-10-04 ENCOUNTER — Encounter: Payer: BC Managed Care – PPO | Attending: Internal Medicine | Admitting: *Deleted

## 2012-10-04 VITALS — Ht 67.0 in | Wt 182.2 lb

## 2012-10-04 DIAGNOSIS — I10 Essential (primary) hypertension: Secondary | ICD-10-CM | POA: Insufficient documentation

## 2012-10-04 DIAGNOSIS — Z713 Dietary counseling and surveillance: Secondary | ICD-10-CM | POA: Insufficient documentation

## 2012-10-04 DIAGNOSIS — E119 Type 2 diabetes mellitus without complications: Secondary | ICD-10-CM | POA: Insufficient documentation

## 2012-10-04 DIAGNOSIS — E663 Overweight: Secondary | ICD-10-CM

## 2012-10-04 NOTE — Patient Instructions (Addendum)
Plan:  Aim for 3 Carb Choices per meal (45 grams) +/- 1 either way  Aim for 0-1 Carbs per snack if hungry  Consider  increasing your activity level by checking into the [facility] for swim lessons

## 2012-10-04 NOTE — Progress Notes (Signed)
  Medical Nutrition Therapy:  Appt start time: 1100 end time:  1200.  Assessment:  Primary concerns today: patient here for pre-diabetes, HTN and obesity. She presents at this visit with a great deal of frustration that she has to take medication for her HTN and BG which she refers to as "dope". She lives with husband, they both food shop but she prepares the meals. She states she removes all salt from her foods. She works Education [employer], [employer] as her own business. 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 She states she especially enjoys flower gardening when the weather permits. No other activities mentioned.  MEDICATIONS: see list   DIETARY INTAKE:  Usual eating pattern includes 2-3 meals and 2-3 snacks per day.  Everyday foods include good variety of all food groups.  Avoided foods include none states.    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) and flavored creamer [facility] too Snk ( AM): none  L ( PM): lettuce OR lettuce with tomato or cheese OR 1 thigh of chicken, water or regular Sprite Snk ( PM): occasionally a dill pickle or a slice of cheese D ( PM): she prepares:prepared fried meat for husband, mixed vegetables or canned butter beans or other vegetable, 1 slice whole wheat bread, unsweetenend tea or water  Snk ( PM): not anymore, used to have popcorn Beverages: water or regular Sprite, coffee, Mountain Dew  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   Nutritional Diagnosis:  NB-1.1 Food and nutrition-related knowledge deficit As related to new diagnosis of  pre-diabetes.  As evidenced by FBG of 110 on 08-29-12.    Intervention:  Nutrition counseling and pre-diabetes education initiated. Discussed basic physiology of pre-diabetes, SMBG target ranges should she decide to pursue obtaining a meter, introduction to Carb Counting and benefits of increased activity. Offered her information about swimming classes at the [facility] which she seemed to be interested in pursuing.   Plan:  Aim for 3 Carb Choices per meal (45 grams) +/- 1 either way  Aim for 0-1 Carbs per snack if hungry  Consider  increasing your activity level by checking into the [facility] for swim lessons  Handouts given during visit include: Living Well with Diabetes Carb Counting and Food Label handouts Meal Plan Card [facility] handout  Monitoring/Evaluation:  Dietary intake, exercise, and body weight prn. Patient chooses to wait to make appointment at a later date.

## 2012-10-07 ENCOUNTER — Ambulatory Visit (INDEPENDENT_AMBULATORY_CARE_PROVIDER_SITE_OTHER): Payer: BC Managed Care – PPO | Admitting: Internal Medicine

## 2012-10-07 VITALS — BP 124/76 | HR 86 | Temp 98.4°F | Resp 16 | Ht 67.0 in | Wt 181.6 lb

## 2012-10-07 DIAGNOSIS — N644 Mastodynia: Secondary | ICD-10-CM

## 2012-10-07 DIAGNOSIS — N6009 Solitary cyst of unspecified breast: Secondary | ICD-10-CM

## 2012-10-07 MED ORDER — MELOXICAM 15 MG PO TABS: 15.0000 mg | ORAL_TABLET | Freq: Every day | ORAL | Status: DC

## 2012-10-08 NOTE — Progress Notes (Signed)
  Subjective:    Patient ID: Alexis Shields, female    DOB: 05/06/1956, 57 y.o.   MRN: [id number]  HPI history of painful swelling in the left breast which is increasing in size and tenderness over the past several days Mammogram-09/12/2012 showed no evidence of cancer Ultrasound one year ago showed cyst in the left upper breast thought to be a fibroadenoma  She feels like this is the same area but this area is greatly enlarged compared to the past No fever No nipple discharge No trauma to breast Excessive intake of caffeine through [facility] despite history of diabetes     Review of Systems     Objective:   Physical Exam  Pulmonary/Chest:     BP 124/76  Pulse 86  Temp 98.4 F (36.9 C) (Oral)  Resp 16  Ht 5\' 7"  (1.702 m)  Wt 181 lb 9.6 oz (82.373 kg)  BMI 28.44 kg/m2  SpO2 100% There are no axillary nodes and no other breast lesions       Assessment & Plan:  Problem #1 breast fibroadenoma  Plan-heat twice a day 20 minutes  Mobic 15 mg daily for 2 weeks Followup if not resolved/improved Can consider for excision if symptoms continue

## 2012-11-14 ENCOUNTER — Other Ambulatory Visit: Payer: Self-pay | Admitting: *Deleted

## 2012-11-14 MED ORDER — METFORMIN HCL ER 500 MG PO TB24: 500.0000 mg | ORAL_TABLET | Freq: Every day | ORAL | Status: DC

## 2012-11-14 MED ORDER — AMLODIPINE BESYLATE 10 MG PO TABS: 10.0000 mg | ORAL_TABLET | Freq: Every day | ORAL | Status: DC

## 2013-02-27 ENCOUNTER — Encounter: Payer: Self-pay | Admitting: Internal Medicine

## 2013-02-27 ENCOUNTER — Other Ambulatory Visit (INDEPENDENT_AMBULATORY_CARE_PROVIDER_SITE_OTHER): Payer: BC Managed Care – PPO

## 2013-02-27 ENCOUNTER — Ambulatory Visit (INDEPENDENT_AMBULATORY_CARE_PROVIDER_SITE_OTHER): Payer: BC Managed Care – PPO | Admitting: Internal Medicine

## 2013-02-27 VITALS — BP 152/94 | HR 83 | Temp 99.3°F | Wt 184.0 lb

## 2013-02-27 DIAGNOSIS — I10 Essential (primary) hypertension: Secondary | ICD-10-CM

## 2013-02-27 DIAGNOSIS — E119 Type 2 diabetes mellitus without complications: Secondary | ICD-10-CM

## 2013-02-27 LAB — BASIC METABOLIC PANEL
BUN: 13 mg/dL (ref 6–23)
GFR: 85.19 mL/min (ref >60.00)
Glucose, Bld: 120 mg/dL — ABNORMAL HIGH (ref 70–99)
Potassium: 4.1 mEq/L (ref 3.5–5.1)

## 2013-02-27 LAB — HEMOGLOBIN A1C: Hgb A1c MFr Bld: 6.7 % — ABNORMAL HIGH (ref 4.6–6.5)

## 2013-02-27 MED ORDER — LISINOPRIL 20 MG PO TABS: 20.0000 mg | ORAL_TABLET | Freq: Every day | ORAL | Status: DC

## 2013-02-27 MED ORDER — ATORVASTATIN CALCIUM 10 MG PO TABS: 10.0000 mg | ORAL_TABLET | Freq: Every day | ORAL | Status: DC

## 2013-02-27 NOTE — Progress Notes (Signed)
  Subjective:    Patient ID: Alexis Shields, female    DOB: [birth year], 57 y.o.   MRN: [id number]  HPI   Here for follow up - reviewed chronic medical issues and interval history  Past Medical History  Diagnosis Date  . Back pain   . Anemia   . Anorexia nervosa     Reported by patient at 08/24/11 visit  . Overweight (BMI 25.0-29.9)   . GERD (gastroesophageal reflux disease)   . Hypertension   . Type 2 diabetes mellitus   . Cancer     Review of Systems  Constitutional: Positive for malaise/fatigue. Negative for fever and unexpected weight change.  Eyes: Negative for blurred vision.  Respiratory: Negative for cough and shortness of breath.   Cardiovascular: Positive for syncope. Negative for chest pain, palpitations, leg swelling and PND.  Gastrointestinal: Negative for anorexia.  Neurological: Positive for tingling. Negative for syncope, numbness, headaches and loss of balance.  Psychiatric/Behavioral: The patient does not have insomnia.         Objective:   Physical Exam BP 152/94  Pulse 83  Temp(Src) 99.3 F (37.4 C) (Oral)  Wt 184 lb (83.462 kg)  BMI 28.81 kg/m2  SpO2 96% Wt Readings from Last 3 Encounters:  02/27/13 184 lb (83.462 kg)  10/07/12 181 lb 9.6 oz (82.373 kg)  10/04/12 182 lb 3.2 oz (82.645 kg)   Constitutional: She is overweight, but appears well-developed and well-nourished. No distress.  Neck: Normal range of motion. Neck supple. No JVD or LAD present. No thyromegaly present.  Cardiovascular: Normal rate, regular rhythm and normal heart sounds.  No murmur heard. No BLE edema. Pulmonary/Chest: Effort normal and breath sounds normal. No respiratory distress. She has no wheezes.  Neurological: She is alert and oriented to person, place, and time. No cranial nerve deficit. Coordination, speech, gait and balance are normal.   Psychiatric: She has a mod anxious mood and affect. Judgment and thought content normal.   Lab Results  Component Value  Date   WBC 5.3 08/29/2012   HGB 13.1 08/29/2012   HCT 38.9 08/29/2012   PLT 251.0 08/29/2012   GLUCOSE 119* 08/29/2012   CHOL 213* 08/29/2012   TRIG 62.0 08/29/2012   HDL 49.90 08/29/2012   LDLDIRECT 153.3 08/29/2012   ALT 20 08/29/2012   AST 19 08/29/2012   NA 137 08/29/2012   K 4.1 08/29/2012   CL 103 08/29/2012   CREATININE 0.8 08/29/2012   BUN 12 08/29/2012   CO2 25 08/29/2012   TSH 0.94 08/29/2012   HGBA1C 7.0* 08/29/2012        Assessment & Plan:   See problem list. Medications and labs reviewed today.

## 2013-02-27 NOTE — Assessment & Plan Note (Signed)
BP Readings from Last 3 Encounters:  02/27/13 152/94  10/07/12 124/76  08/29/12 128/90   hypertension urgency events reviewed - precipitated by stress late summer 2013 Changed ARB to amlodipine 05/2012 -reports compliace with same but still poor control Add ACEI (also with DM co-dx)

## 2013-02-27 NOTE — Patient Instructions (Signed)
It was good to see you today. We have reviewed your prior records including labs and tests today Medications reviewed and updated, continue same medications and start 2 new ones: cholesterol pill and a 2nd blood pressure pill Your prescription(s) have been submitted to your pharmacy. Please take as directed and contact our office if you believe you are having problem(s) with the medication(s). Test(s) ordered today. Your results will be released to MyChart (or called to you) after review, usually within 72hours after test completion. If any changes need to be made, you will be notified at that same time. Please schedule followup in 6 months for blood pressure and sugar (a1c) check, call sooner if problems.

## 2013-02-27 NOTE — Assessment & Plan Note (Signed)
Lab Results  Component Value Date   HGBA1C 7.0* 08/29/2012   Dx same 2009 -  Began metformin 08/2012 due to rising a1c trend - S/p nutrition eval x 09/2012 for same Recheck a1c now and adjust as needed Add low dose statin and ACEI - see below The patient is asked to make an attempt to improve diet and exercise patterns to aid in medical management of this problem.

## 2013-04-09 ENCOUNTER — Ambulatory Visit (INDEPENDENT_AMBULATORY_CARE_PROVIDER_SITE_OTHER): Payer: BC Managed Care – PPO | Admitting: Family Medicine

## 2013-04-09 ENCOUNTER — Ambulatory Visit: Payer: BC Managed Care – PPO

## 2013-04-09 VITALS — BP 140/92 | HR 86 | Temp 98.6°F | Resp 18 | Ht 66.0 in | Wt 185.2 lb

## 2013-04-09 DIAGNOSIS — M545 Low back pain, unspecified: Secondary | ICD-10-CM

## 2013-04-09 MED ORDER — CYCLOBENZAPRINE HCL 10 MG PO TABS: 10.0000 mg | ORAL_TABLET | Freq: Two times a day (BID) | ORAL | Status: DC | PRN

## 2013-04-09 MED ORDER — NAPROXEN 500 MG PO TABS: 500.0000 mg | ORAL_TABLET | Freq: Two times a day (BID) | ORAL | Status: DC

## 2013-04-09 MED ORDER — HYDROCODONE-ACETAMINOPHEN 5-325 MG PO TABS: 1.0000 | ORAL_TABLET | Freq: Three times a day (TID) | ORAL | Status: DC | PRN

## 2013-04-09 NOTE — Progress Notes (Signed)
[facility], [place] [address] [place] [address] [phone]  Date:  04/09/2013   Name:  Alexis Shields   DOB:  11/15/1955   MRN:  [id number]  PCP:  Rene Paci, MD    Chief Complaint: Back Pain   History of Present Illness:  Alexis Shields is a 57 y.o. very pleasant female patient who presents with the following:  History of HTN, DM2, back pain, GERD.   She cleaned the oven yesterday and heard a pop in her back- when she tried to stand-up her back hurt.  She has tried icy- hot strips which did not help, tylenol and aleve did not help.  The pain does go down her left leg.  However, she has no weakness or numbness of her leg  She has had this sort of problem in the past.  Usually treat with "pain medicine, muscle relaxers and anti- inflammatories."  Reviewed paper chart which shows a few visits for back pain, generally treated with naproxen, vicodin or percocet and flexeril or robaxin.   No incontinence bowel and bladder.   Patient Active Problem List   Diagnosis Date Noted  . Hypertension   . Anxiety 05/18/2012  . Type 2 diabetes mellitus   . Overweight (BMI 25.0-29.9)   . GERD (gastroesophageal reflux disease)   . Hot flash, menopausal   . Back pain     Past Medical History  Diagnosis Date  . Back pain   . Anemia   . Anorexia nervosa     Reported by patient at 08/24/11 visit  . Overweight (BMI 25.0-29.9)   . GERD (gastroesophageal reflux disease)   . Hypertension   . Type 2 diabetes mellitus   . Cancer     Past Surgical History  Procedure Laterality Date  . No past surgeries      History  Substance Use Topics  . Smoking status: Never Smoker   . Smokeless tobacco: Not on file  . Alcohol Use: Yes     Comment: Socially - 1x/mo    Family History  Problem Relation Age of Onset  . Kidney failure Mother   . Alcohol abuse Father   . Liver disease Father     Allergies  Allergen Reactions  . Fexofenadine Hcl      Medication list has been reviewed and updated.  Current Outpatient Prescriptions on File Prior to Visit  Medication Sig Dispense Refill  . amLODipine (NORVASC) 10 MG tablet Take 1 tablet (10 mg total) by mouth daily.  90 tablet  3  . atorvastatin (LIPITOR) 10 MG tablet Take 1 tablet (10 mg total) by mouth daily.  90 tablet  3  . lisinopril (PRINIVIL,ZESTRIL) 20 MG tablet Take 1 tablet (20 mg total) by mouth daily.  90 tablet  3  . metFORMIN (GLUCOPHAGE XR) 500 MG 24 hr tablet Take 1 tablet (500 mg total) by mouth daily with breakfast.  90 tablet  3   No current facility-administered medications on file prior to visit.    Review of Systems:  As per HPI- otherwise negative.   Physical Examination: Filed Vitals:   04/09/13 1701  BP: 140/92  Pulse: 86  Temp: 98.6 F (37 C)  Resp: 18   Filed Vitals:   04/09/13 1701  Height: 5\' 6"  (1.676 m)  Weight: 185 lb 3.2 oz (84.006 kg)   Body mass index is 29.91 kg/(m^2). Ideal Body Weight: Weight in (lb) to have BMI = 25: 154.6  GEN: WDWN,  NAD, Non-toxic, A & O x 3 HEENT: Atraumatic, Normocephalic. Neck supple. No masses, No LAD. Ears and Nose: No external deformity. CV: RRR, No M/G/R. No JVD. No thrill. No extra heart sounds. PULM: CTA B, no wheezes, crackles, rhonchi. No retractions. No resp. distress. No accessory muscle use. ABD: S, NT, ND EXTR: No c/c/e NEURO slow gait  PSYCH: Normally interactive. Conversant. Not depressed or anxious appearing.  Calm demeanor.  Back: she is very tender over the lumbar spine, and over the left lumbar muscles.  Negative SLR bilaterally, no saddle anesthesia.  Normal leg strength, sensation and DTR bilaterally.  No rash or lesion of her back  [facility] reading (PRIMARY) by  Dr. Patsy Lager. Lumbar spine: negative except for minimal degenerative change Clinical Data: Low back pain  LUMBAR SPINE - COMPLETE 4+ VIEW  Comparison: None.  Findings: Five lumbar type vertebral bodies are well  visualized. Vertebral body height is well-maintained. No spondylolysis or spondylolisthesis is seen. Mild disc space narrowing is noted at L4-5.  IMPRESSION: Mild degenerative change without acute abnormality.  Clinically significant discrepancy from primary report, if provided: None  Assessment and Plan: Lower back pain - Plan: DG Lumbar Spine Complete, HYDROcodone-acetaminophen (NORCO/VICODIN) 5-325 MG per tablet, naproxen (NAPROSYN) 500 MG tablet, cyclobenzaprine (FLEXERIL) 10 MG tablet  Alexis Shields is here with recurrent back pain.  She has been treated successfully with a regimen of NSAID, vicodin and muscle relaxer in the past.  Rx naproxen, vicodin and flexeril.  However, cautioned her against using the flexeril and vicodin together, as this combing ation could be overly sedating. Do not drive while taking sedating medications.   Signed Abbe Amsterdam, MD

## 2013-04-09 NOTE — Patient Instructions (Addendum)
Use the medications as needed.  Let us know if you are not better in the next few days- Sooner if worse.

## 2013-06-02 ENCOUNTER — Encounter: Payer: Self-pay | Admitting: Internal Medicine

## 2013-06-19 ENCOUNTER — Ambulatory Visit (INDEPENDENT_AMBULATORY_CARE_PROVIDER_SITE_OTHER): Payer: BC Managed Care – PPO | Admitting: Family Medicine

## 2013-06-19 VITALS — BP 148/88 | HR 91 | Temp 98.8°F | Resp 16 | Ht 66.0 in | Wt 185.6 lb

## 2013-06-19 DIAGNOSIS — R200 Anesthesia of skin: Secondary | ICD-10-CM

## 2013-06-19 DIAGNOSIS — E119 Type 2 diabetes mellitus without complications: Secondary | ICD-10-CM

## 2013-06-19 DIAGNOSIS — R209 Unspecified disturbances of skin sensation: Secondary | ICD-10-CM

## 2013-06-19 DIAGNOSIS — M549 Dorsalgia, unspecified: Secondary | ICD-10-CM

## 2013-06-19 DIAGNOSIS — M545 Low back pain: Secondary | ICD-10-CM

## 2013-06-19 DIAGNOSIS — R05 Cough: Secondary | ICD-10-CM

## 2013-06-19 DIAGNOSIS — J209 Acute bronchitis, unspecified: Secondary | ICD-10-CM

## 2013-06-19 LAB — POCT URINALYSIS DIPSTICK
Bilirubin, UA: NEGATIVE
Glucose, UA: NEGATIVE
Ketones, UA: NEGATIVE
Nitrite, UA: NEGATIVE
Protein, UA: NEGATIVE
Spec Grav, UA: 1.02
Urobilinogen, UA: 0.2
pH, UA: 6.5

## 2013-06-19 LAB — POCT UA - MICROSCOPIC ONLY: Casts, Ur, LPF, POC: NEGATIVE

## 2013-06-19 LAB — POCT CBC
Granulocyte percent: 47.7 %G (ref 37–80)
HCT, POC: 43.1 % (ref 37.7–47.9)
Hemoglobin: 13.5 g/dL (ref 12.2–16.2)
Lymph, poc: 3 (ref 0.6–3.4)
MCH, POC: 26.9 pg — AB (ref 27–31.2)
MCHC: 31.3 g/dL — AB (ref 31.8–35.4)
MCV: 86 fL (ref 80–97)
MID (cbc): 0.4 (ref 0–0.9)
MPV: 9 fL (ref 0–99.8)
POC Granulocyte: 3.1 (ref 2–6.9)
POC LYMPH PERCENT: 45.7 %L (ref 10–50)
POC MID %: 6.6 %M (ref 0–12)
Platelet Count, POC: 206 10*3/uL (ref 142–424)
RBC: 5.01 M/uL (ref 4.04–5.48)
RDW, POC: 13.8 %
WBC: 6.6 10*3/uL (ref 4.6–10.2)

## 2013-06-19 LAB — POCT SEDIMENTATION RATE: POCT SED RATE: 23 mm/hr — AB (ref 0–22)

## 2013-06-19 LAB — TSH: TSH: 1.538 u[IU]/mL (ref 0.350–4.500)

## 2013-06-19 LAB — GLUCOSE, POCT (MANUAL RESULT ENTRY): POC Glucose: 93 mg/dl (ref 70–99)

## 2013-06-19 LAB — POCT GLYCOSYLATED HEMOGLOBIN (HGB A1C): Hemoglobin A1C: 6.3

## 2013-06-19 MED ORDER — AZITHROMYCIN 250 MG PO TABS: ORAL_TABLET | ORAL | Status: DC

## 2013-06-19 MED ORDER — PREDNISONE 20 MG PO TABS: 40.0000 mg | ORAL_TABLET | Freq: Every day | ORAL | Status: DC

## 2013-06-19 MED ORDER — NAPROXEN 500 MG PO TABS: 500.0000 mg | ORAL_TABLET | Freq: Two times a day (BID) | ORAL | Status: DC

## 2013-06-19 MED ORDER — CYCLOBENZAPRINE HCL 10 MG PO TABS: 10.0000 mg | ORAL_TABLET | Freq: Two times a day (BID) | ORAL | Status: DC | PRN

## 2013-06-19 MED ORDER — BENZONATATE 200 MG PO CAPS: 200.0000 mg | ORAL_CAPSULE | Freq: Three times a day (TID) | ORAL | Status: DC | PRN

## 2013-06-19 MED ORDER — HYDROCODONE-ACETAMINOPHEN 5-325 MG PO TABS: 1.0000 | ORAL_TABLET | Freq: Three times a day (TID) | ORAL | Status: DC | PRN
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
## 2013-06-19 NOTE — Patient Instructions (Addendum)
Your blood pressure is to high today.  This is hopefully due to the fact that you are in pain and not that you need more BP medication. I am concerned about your left foot numbness - you also do not seem to feel light sensation on your right foot perfectly.  This could be a side effect of uncontrolled diabetes or could also be connected with some other things such as vitamin deficiencies or certain infections. If this continues, please follow-up for further testing and consider specialty neurology referral if no lab abnormality is found.   Peripheral Neuropathy Peripheral neuropathy is a common disorder of your nerves resulting from damage. CAUSES  This disorder may be caused by a disease of the nerves or illness. Many neuropathies have well known causes such as:  Diabetes. This is one of the most common causes.   Uremia.   AIDS.   Nutritional deficiencies.   Other causes include mechanical pressures. These may be from:   Compression.   Injury.   Contusions or bruises.   Fracture or dislocated bones.   Pressure involving the nerves close to the surface. Nerves such as the ulnar, or radial can be injured by prolonged use of crutches.  Other injuries may come from:  Tumor.   Hemorrhage or bleeding into a nerve.   Exposure to cold or radiation.   Certain medicines or toxic substances (rare).   Vascular or collagen disorders such as:   Atherosclerosis.   Systemic lupus erythematosus.   Scleroderma.   Sarcoidosis.   Rheumatoid arthritis.   Polyarteritis nodosa.   A large number of cases are of unknown cause.  SYMPTOMS  Common problems include:  Weakness.   Numbness.   Abnormal sensations (paresthesia) such as:   Burning.   Tickling.   Pricking.   Tingling.   Pain in the arms, hands, legs and/or feet.  TREATMENT  Therapy for this disorder differs depending on the cause. It may vary from medical treatment with medications or physical therapy among  others.   For example, therapy for this disorder caused by diabetes involves control of the diabetes.   In cases where a tumor or ruptured disc is the cause, therapy may involve surgery. This would be to remove the tumor or to repair the ruptured disc.   In entrapment or compression neuropathy, treatment may consist of splinting or surgical decompression of the ulnar or median nerves. A common example of entrapment neuropathy is carpal tunnel syndrome. This has become more common because of the increasing use of computers.   Peroneal and radial compression neuropathies may require avoidance of pressure.   Physical therapy and/or splints may be useful in preventing contractures. This is a condition in which shortened muscles around joints cause abnormal and sometimes painful positioning of the joints.  Document Released: 08/14/2002 Document Revised: 05/06/2011 Document Reviewed: 08/24/2005 [facility] Patient Information 2012 [place], [place].

## 2013-06-20 ENCOUNTER — Encounter: Payer: Self-pay | Admitting: Family Medicine

## 2013-06-21 LAB — URINE CULTURE: Colony Count: 40000

## 2013-08-21 ENCOUNTER — Encounter: Payer: Self-pay | Admitting: Internal Medicine

## 2013-08-21 ENCOUNTER — Ambulatory Visit (INDEPENDENT_AMBULATORY_CARE_PROVIDER_SITE_OTHER)
Admission: RE | Admit: 2013-08-21 | Discharge: 2013-08-21 | Disposition: A | Discharge status: Home / Self Care | Payer: BC Managed Care – PPO | Source: Ambulatory Visit | Attending: Internal Medicine | Admitting: Internal Medicine

## 2013-08-21 ENCOUNTER — Ambulatory Visit (INDEPENDENT_AMBULATORY_CARE_PROVIDER_SITE_OTHER): Payer: BC Managed Care – PPO | Admitting: Internal Medicine

## 2013-08-21 VITALS — BP 122/78 | HR 82 | Temp 98.1°F | Wt 185.6 lb

## 2013-08-21 DIAGNOSIS — E119 Type 2 diabetes mellitus without complications: Secondary | ICD-10-CM

## 2013-08-21 DIAGNOSIS — R05 Cough: Secondary | ICD-10-CM

## 2013-08-21 DIAGNOSIS — R059 Cough, unspecified: Secondary | ICD-10-CM

## 2013-08-21 DIAGNOSIS — I10 Essential (primary) hypertension: Secondary | ICD-10-CM

## 2013-08-21 MED ORDER — FLUTICASONE PROPIONATE 50 MCG/ACT NA SUSP: 1.0000 | Freq: Every day | NASAL | Status: DC

## 2013-08-21 MED ORDER — OMEPRAZOLE 20 MG PO CPDR: 20.0000 mg | DELAYED_RELEASE_CAPSULE | Freq: Every day | ORAL | Status: DC

## 2013-08-21 MED ORDER — HYDROCOD POLST-CHLORPHEN POLST 10-8 MG/5ML PO LQCR: 5.0000 mL | Freq: Two times a day (BID) | ORAL | Status: DC | PRN

## 2013-08-21 NOTE — Assessment & Plan Note (Signed)
Lab Results  Component Value Date   HGBA1C 6.3 06/19/2013   Dx same 2009 -  Began metformin 08/2012 due to rising a1c trend - S/p nutrition eval x 09/2012 for same Recheck a1c q30mo and adjust as needed Refuses low dose statin  The patient is asked to make an attempt to improve diet and exercise patterns to aid in medical management of this problem.

## 2013-08-21 NOTE — Progress Notes (Signed)
  Subjective:    Patient ID: Alexis Shields, female    DOB: Feb 09, 1956, 57 y.o.   MRN: [id number]  HPI  Here for follow up - reviewed chronic medical issues and interval history  Past Medical History  Diagnosis Date  . Back pain   . Anemia   . Anorexia nervosa     Reported by patient at 08/24/11 visit  . Overweight (BMI 25.0-29.9)   . GERD (gastroesophageal reflux disease)   . Hypertension   . Type 2 diabetes mellitus     Review of Systems  Constitutional: Positive for fatigue. Negative for fever and unexpected weight change.  Respiratory: Positive for cough (at night >2 mo). Negative for chest tightness, shortness of breath and wheezing.   Cardiovascular: Negative for chest pain and leg swelling.        Objective:   Physical ExamBP 122/78  Pulse 82  Temp(Src) 98.1 F (36.7 C) (Oral)  Wt 185 lb 9.6 oz (84.188 kg)  SpO2 98% Wt Readings from Last 3 Encounters:  08/21/13 185 lb 9.6 oz (84.188 kg)  06/19/13 185 lb 9.6 oz (84.188 kg)  04/09/13 185 lb 3.2 oz (84.006 kg)   Constitutional: She is overweight, but appears well-developed and well-nourished. No distress.  HENT: NCAT, nontender sinus palpation. Tympanic membranes clear bilaterally. Nose with swollen turbinates but no discharge. Oropharynx with erythema and postnasal drip, no exudate or oral lesions Neck: Normal range of motion. Neck supple. No JVD or LAD present. No thyromegaly present.  Cardiovascular: Normal rate, regular rhythm and normal heart sounds.  No murmur heard. No BLE edema. Pulmonary/Chest: Effort normal. Few scattered rhonchi but no crackles. No respiratory distress. She has no wheezes.  Neurological: She is alert and oriented to person, place, and time. No cranial nerve deficit. Coordination, speech, gait and balance are normal.   Psychiatric: She has a moderately anxious mood and affect. Judgment and thought content normal.   Lab Results  Component Value Date   WBC 6.6 06/19/2013   HGB 13.5  06/19/2013   HCT 43.1 06/19/2013   PLT 251.0 08/29/2012   GLUCOSE 120* 02/27/2013   CHOL 213* 08/29/2012   TRIG 62.0 08/29/2012   HDL 49.90 08/29/2012   LDLDIRECT 153.3 08/29/2012   ALT 20 08/29/2012   AST 19 08/29/2012   NA 140 02/27/2013   K 4.1 02/27/2013   CL 102 02/27/2013   CREATININE 0.9 02/27/2013   BUN 13 02/27/2013   CO2 30 02/27/2013   TSH 1.538 06/19/2013   HGBA1C 6.3 06/19/2013        Assessment & Plan:   Cough - ongoing > 80mo - suspect PND with allergic sinusitis, but check CXR given >2 mo symptoms  Add nasal steroids and tussionex qhs Encouraged omeprazole qd x 30d, then as needed  Also see problem list. Medications and labs reviewed today.

## 2013-08-21 NOTE — Patient Instructions (Addendum)
It was good to see you today.  We have reviewed your prior records including labs and tests today  Test(s) ordered today. Your results will be released to MyChart (or called to you) after review, usually within 72hours after test completion. If any changes need to be made, you will be notified at that same time.  Medications reviewed and updated: We removed Lipitor from your list  Take omeprazole once daily for 30 days and nasal steroid he today for 30 days to help control cough. Use prescription cough syrup at night and every 12 hours as needed for cough control  Your prescription(s) have been submitted to your pharmacy (cough syrup prescription given to you to take to your pharmacy). Please take as directed and contact our office if you believe you are having problem(s) with the medication(s).   Continue the good work on your diabetes and blood pressure as we discussed -take all medications as prescribed!  Please schedule followup in 6 months, call sooner if problems. Diabetes and Standards of Medical Care  Diabetes is complicated. You may find that your diabetes team includes a dietitian, nurse, diabetes educator, eye doctor, and more. To help everyone know what is going on and to help you get the care you deserve, the following schedule of care was developed to help keep you on track. Below are the tests, exams, vaccines, medicines, education, and plans you will need. HbA1c test This test shows how well you have controlled your glucose over the past 2 3 months. It is used to see if your diabetes management plan needs to be adjusted.   It is performed at least 2 times a year if you are meeting treatment goals.  It is performed 4 times a year if therapy has changed or if you are not meeting treatment goals. Blood pressure test  This test is performed at every routine medical visit. The goal is less than 140/90 mmHg for most people, but 130/80 mmHg in some cases. Ask your health care  provider about your goal. Dental exam  Follow up with the dentist regularly. Eye exam  If you are diagnosed with type 1 diabetes as a child, get an exam upon reaching the age of 10 years or older and have had diabetes for 3 5 years. Yearly eye exams are recommended after that initial eye exam.  If you are diagnosed with type 1 diabetes as an adult, get an exam within 5 years of diagnosis and then yearly.  If you are diagnosed with type 2 diabetes, get an exam as soon as possible after the diagnosis and then yearly. Foot care exam  Visual foot exams are performed at every routine medical visit. The exams check for cuts, injuries, or other problems with the feet.  A comprehensive foot exam should be done yearly. This includes visual inspection as well as assessing foot pulses and testing for loss of sensation.  Check your feet nightly for cuts, injuries, or other problems with your feet. Tell your health care provider if anything is not healing. Kidney function test (urine microalbumin)  This test is performed once a year.  Type 1 diabetes: The first test is performed 5 years after diagnosis.  Type 2 diabetes: The first test is performed at the time of diagnosis.  A serum creatinine and estimated glomerular filtration rate (eGFR) test is done once a year to assess the level of chronic kidney disease (CKD), if present. Lipid profile (cholesterol, HDL, LDL, triglycerides)  Performed every 5 years  for most people.  The goal for LDL is less than 100 mg/dL. If you are at high risk, the goal is less than 70 mg/dL.  The goal for HDL is 40 mg/dL 50 mg/dL for men and 50 mg/dL 60 mg/dL for women. An HDL cholesterol of 60 mg/dL or higher gives some protection against heart disease.  The goal for triglycerides is less than 150 mg/dL. Influenza vaccine, pneumococcal vaccine, and hepatitis B vaccine  The influenza vaccine is recommended yearly.  The pneumococcal vaccine is generally given  once in a lifetime. However, there are some instances when another vaccination is recommended. Check with your health care provider.  The hepatitis B vaccine is also recommended for adults with diabetes. Diabetes self-management education  Education is recommended at diagnosis and ongoing as needed. Treatment plan  Your treatment plan is reviewed at every medical visit. Document Released: 06/21/2009 Document Revised: 04/26/2013 Document Reviewed: 01/24/2013 [facility] Patient Information 2014 [place], [place].

## 2013-08-21 NOTE — Assessment & Plan Note (Signed)
BP Readings from Last 3 Encounters:  08/21/13 122/78  06/19/13 148/88  04/09/13 140/92   Historically recurrent hypertension urgency events reviewed - precipitated by stress late summer 2013 Changed ARB to amlodipine 05/2012 -reports compliace with same but still poor control Added ACEI 06/2013 due to DM co-dx

## 2013-08-21 NOTE — Progress Notes (Signed)
Pre-visit discussion using our clinic review tool. No additional management support is needed unless otherwise documented below in the visit note.  

## 2013-09-13 ENCOUNTER — Telehealth: Payer: Self-pay | Admitting: Internal Medicine

## 2013-09-13 MED ORDER — HYDROCOD POLST-CHLORPHEN POLST 10-8 MG/5ML PO LQCR: 5.0000 mL | Freq: Two times a day (BID) | ORAL | Status: DC | PRN

## 2013-09-13 NOTE — Telephone Encounter (Signed)
Notified pt rx ready for pick-up.../lmb 

## 2013-09-13 NOTE — Telephone Encounter (Signed)
Refill ok - printed and signed

## 2013-09-13 NOTE — Telephone Encounter (Signed)
09/13/2013  Pt states that they are still coughing and would like to have a refill on rx chlorpheniramine-HYDROcodone (TUSSIONEX PENNKINETIC ER) 10-8 MG/5ML.  If can't be refilled, pt says they will come in for an office visit to.  Please contact pt to advise.

## 2013-09-25 ENCOUNTER — Other Ambulatory Visit: Payer: Self-pay | Admitting: Internal Medicine

## 2014-01-15 ENCOUNTER — Other Ambulatory Visit: Payer: Self-pay | Admitting: Internal Medicine

## 2014-02-19 ENCOUNTER — Encounter: Payer: Self-pay | Admitting: Internal Medicine

## 2014-02-19 ENCOUNTER — Ambulatory Visit (INDEPENDENT_AMBULATORY_CARE_PROVIDER_SITE_OTHER): Payer: BC Managed Care – PPO | Admitting: Internal Medicine

## 2014-02-19 ENCOUNTER — Other Ambulatory Visit (INDEPENDENT_AMBULATORY_CARE_PROVIDER_SITE_OTHER): Payer: BC Managed Care – PPO

## 2014-02-19 VITALS — BP 130/78 | HR 87 | Temp 98.8°F | Ht 65.5 in | Wt 184.0 lb

## 2014-02-19 DIAGNOSIS — I10 Essential (primary) hypertension: Secondary | ICD-10-CM

## 2014-02-19 DIAGNOSIS — E119 Type 2 diabetes mellitus without complications: Secondary | ICD-10-CM

## 2014-02-19 DIAGNOSIS — E663 Overweight: Secondary | ICD-10-CM

## 2014-02-19 LAB — BASIC METABOLIC PANEL
BUN: 8 mg/dL (ref 6–23)
CO2: 27 mEq/L (ref 19–32)
CREATININE: 0.8 mg/dL (ref 0.4–1.2)
Calcium: 9.9 mg/dL (ref 8.4–10.5)
Chloride: 106 mEq/L (ref 96–112)
GFR: 94.77 mL/min (ref >60.00)
GLUCOSE: 125 mg/dL — AB (ref 70–99)
POTASSIUM: 4.4 meq/L (ref 3.5–5.1)
Sodium: 140 mEq/L (ref 135–145)

## 2014-02-19 LAB — HEPATIC FUNCTION PANEL
ALT: 15 U/L (ref 0–35)
AST: 18 U/L (ref 0–37)
Albumin: 4.3 g/dL (ref 3.5–5.2)
Alkaline Phosphatase: 69 U/L (ref 39–117)
BILIRUBIN TOTAL: 0.5 mg/dL (ref 0.2–1.2)
Bilirubin, Direct: 0.1 mg/dL (ref 0.0–0.3)
Total Protein: 7.3 g/dL (ref 6.0–8.3)

## 2014-02-19 LAB — HEMOGLOBIN A1C: HEMOGLOBIN A1C: 6.8 % — AB (ref 4.6–6.5)

## 2014-02-19 LAB — LIPID PANEL
CHOLESTEROL: 218 mg/dL — AB (ref 0–200)
HDL: 53.5 mg/dL (ref >39.00)
LDL Cholesterol: 147 mg/dL — ABNORMAL HIGH (ref 0–99)
NonHDL: 164.5
TRIGLYCERIDES: 86 mg/dL (ref 0.0–149.0)
Total CHOL/HDL Ratio: 4
VLDL: 17.2 mg/dL (ref 0.0–40.0)

## 2014-02-19 LAB — TSH: TSH: 1.05 u[IU]/mL (ref 0.35–4.50)

## 2014-02-19 LAB — MICROALBUMIN / CREATININE URINE RATIO
Creatinine,U: 339.5 mg/dL
MICROALB/CREAT RATIO: 0.4 mg/g (ref 0.0–30.0)
Microalb, Ur: 1.3 mg/dL (ref 0.0–1.9)

## 2014-02-19 NOTE — Progress Notes (Signed)
Pre visit review using our clinic review tool, if applicable. No additional management support is needed unless otherwise documented below in the visit note. 

## 2014-02-19 NOTE — Assessment & Plan Note (Signed)
BP Readings from Last 3 Encounters:  02/19/14 130/78  08/21/13 122/78  06/19/13 148/88   Historically recurrent hypertension urgency events reviewed - precipitated by stress late summer 2013 Changed ARB to amlodipine 05/2012 -reports compliace with same but still poor control Added ACEI 06/2013 due to DM co-dx - but stopped early 2015 due to cough ?edema from amlodipine - reports B ankle dep at end of day in summer heat - but pt reluctant to consider other medication

## 2014-02-19 NOTE — Patient Instructions (Signed)
It was good to see you today.  We have reviewed your prior records including labs and tests today  Test(s) ordered today. Your results will be released to [place] (or called to you) after review, usually within 72hours after test completion. If any changes need to be made, you will be notified at that same time.  Medications reviewed and updated, no changes recommended at this time.  Please schedule followup in 6 months for diabetes mellitus check, call sooner if problems.  Continue to work on lifestyle changes as discussed (low fat, low carb, increased protein diet; improved exercise efforts; weight loss) to control sugar, blood pressure and cholesterol levels and/or reduce risk of developing other medical problems. Look into [email or web address].[email or web address] or other type of food journal to assist you in this process.

## 2014-02-19 NOTE — Assessment & Plan Note (Signed)
Wt Readings from Last 3 Encounters:  02/19/14 184 lb (83.462 kg)  08/21/13 185 lb 9.6 oz (84.188 kg)  06/19/13 185 lb 9.6 oz (84.188 kg)   S/p nutritionist eval 12/12, 1/13 - frustrated by inability to change weight Trial phentermine 02/2012 without success Encouragement provided

## 2014-02-19 NOTE — Progress Notes (Signed)
Subjective:    Patient ID: Alexis Shields, female    DOB: 1956-04-10, 58 y.o.   MRN: [id number]  HPI  Patient is here for follow up  Reviewed chronic medical issues and interval medical events  Past Medical History  Diagnosis Date  . Back pain   . Anemia   . Overweight (BMI 25.0-29.9)   . GERD (gastroesophageal reflux disease)   . Hypertension   . Type 2 diabetes mellitus     Review of Systems  Constitutional: Negative for fever and unexpected weight change.  Respiratory: Negative for cough (resolved w/ DC of ACEI) and shortness of breath.   Cardiovascular: Positive for leg swelling (end of day, ankles, resolved with elevation and decreased salt). Negative for chest pain.       Objective:   Physical Exam  BP 130/78  Pulse 87  Temp(Src) 98.8 F (37.1 C) (Oral)  Ht 5' 5.5" (1.664 m)  Wt 184 lb (83.462 kg)  BMI 30.14 kg/m2  SpO2 97% Wt Readings from Last 3 Encounters:  02/19/14 184 lb (83.462 kg)  08/21/13 185 lb 9.6 oz (84.188 kg)  06/19/13 185 lb 9.6 oz (84.188 kg)   Constitutional: She is overweight, appears well-developed and well-nourished. No distress.  Neck: Normal range of motion. Neck supple. No JVD present. No thyromegaly present.  Cardiovascular: Normal rate, regular rhythm and normal heart sounds.  No murmur heard. No BLE edema. Pulmonary/Chest: Effort normal and breath sounds normal. No respiratory distress. She has no wheezes.  Psychiatric: She has a normal mood and affect. Her behavior is normal. Judgment and thought content normal.   Lab Results  Component Value Date   WBC 6.6 06/19/2013   HGB 13.5 06/19/2013   HCT 43.1 06/19/2013   PLT 251.0 08/29/2012   GLUCOSE 120* 02/27/2013   CHOL 213* 08/29/2012   TRIG 62.0 08/29/2012   HDL 49.90 08/29/2012   LDLDIRECT 153.3 08/29/2012   ALT 20 08/29/2012   AST 19 08/29/2012   NA 140 02/27/2013   K 4.1 02/27/2013   CL 102 02/27/2013   CREATININE 0.9 02/27/2013   BUN 13 02/27/2013   CO2 30 02/27/2013     TSH 1.538 06/19/2013   HGBA1C 6.3 06/19/2013    Dg Chest 2 View  08/21/2013   CLINICAL DATA:  Shortness of breath, chest pain.  EXAM: CHEST  2 VIEW  COMPARISON:  None.  FINDINGS: The heart size and mediastinal contours are within normal limits. Both lungs are clear. The visualized skeletal structures are unremarkable.  IMPRESSION: No active cardiopulmonary disease.   Electronically Signed   By: Kathreen Devoid   On: 08/21/2013 12:29       Assessment & Plan:   Problem List Items Addressed This Visit   Hypertension      BP Readings from Last 3 Encounters:  02/19/14 130/78  08/21/13 122/78  06/19/13 148/88   Historically recurrent hypertension urgency events reviewed - precipitated by stress late summer 2013 Changed ARB to amlodipine 05/2012 -reports compliace with same but still poor control Added ACEI 06/2013 due to DM co-dx - but stopped early 2015 due to cough ?edema from amlodipine - reports B ankle dep at end of day in summer heat - but pt reluctant to consider other medication    Relevant Orders      Hepatic function panel      Lipid panel      TSH   Overweight (BMI 25.0-29.9)      Wt Readings from Last 3  Encounters:  02/19/14 184 lb (83.462 kg)  08/21/13 185 lb 9.6 oz (84.188 kg)  06/19/13 185 lb 9.6 oz (84.188 kg)   S/p nutritionist eval 12/12, 1/13 - frustrated by inability to change weight Trial phentermine 02/2012 without success Encouragement provided    Relevant Orders      Hepatic function panel      TSH   Type 2 diabetes mellitus - Primary      Lab Results  Component Value Date   HGBA1C 6.3 06/19/2013  Dx same 2009 -  Began metformin 08/2012 due to rising a1c trend - S/p nutrition eval x 09/2012 for same Recheck a1c q47mo and adjust as needed Refuses statin - allg updated Also reports ACEI caused cough per ENT - off same - allg list updated The patient is asked to make an attempt to improve diet and exercise patterns to aid in medical management of this  problem.    Relevant Orders      Basic metabolic panel      Hepatic function panel      Lipid panel      Hemoglobin A1c      Microalbumin / creatinine urine ratio

## 2014-02-19 NOTE — Assessment & Plan Note (Signed)
Lab Results  Component Value Date   HGBA1C 6.3 06/19/2013  Dx same 2009 -  Began metformin 08/2012 due to rising a1c trend - S/p nutrition eval x 09/2012 for same Recheck a1c q28mo and adjust as needed Refuses statin - allg updated Also reports ACEI caused cough per ENT - off same - allg list updated The patient is asked to make an attempt to improve diet and exercise patterns to aid in medical management of this problem.

## 2014-05-16 ENCOUNTER — Other Ambulatory Visit: Payer: Self-pay | Admitting: Internal Medicine

## 2014-05-21 LAB — HM DIABETES EYE EXAM

## 2014-06-15 ENCOUNTER — Encounter: Payer: Self-pay | Admitting: Internal Medicine

## 2014-08-20 ENCOUNTER — Other Ambulatory Visit (INDEPENDENT_AMBULATORY_CARE_PROVIDER_SITE_OTHER): Payer: BC Managed Care – PPO

## 2014-08-20 ENCOUNTER — Encounter: Payer: Self-pay | Admitting: Internal Medicine

## 2014-08-20 ENCOUNTER — Ambulatory Visit (INDEPENDENT_AMBULATORY_CARE_PROVIDER_SITE_OTHER): Payer: BC Managed Care – PPO | Admitting: Internal Medicine

## 2014-08-20 VITALS — BP 138/70 | HR 77 | Temp 98.4°F | Wt 188.0 lb

## 2014-08-20 DIAGNOSIS — E1140 Type 2 diabetes mellitus with diabetic neuropathy, unspecified: Secondary | ICD-10-CM

## 2014-08-20 DIAGNOSIS — Z0000 Encounter for general adult medical examination without abnormal findings: Secondary | ICD-10-CM

## 2014-08-20 LAB — HEMOGLOBIN A1C: Hgb A1c MFr Bld: 7.3 % — ABNORMAL HIGH (ref 4.6–6.5)

## 2014-08-20 MED ORDER — METFORMIN HCL ER 500 MG PO TB24: 500.0000 mg | ORAL_TABLET | Freq: Every day | ORAL | Status: DC

## 2014-08-20 MED ORDER — AMLODIPINE BESYLATE 10 MG PO TABS: 10.0000 mg | ORAL_TABLET | Freq: Every day | ORAL | Status: DC

## 2014-08-20 NOTE — Patient Instructions (Signed)
It was good to see you today.  We have reviewed your prior records including labs and tests today  Health Maintenance reviewed - let us know if you change your mind about flu and pneumonia immunizations. All other recommended immunizations and age-appropriate screenings are up-to-date.  Test(s) ordered today. Your results will be released to [place] (or called to you) after review, usually within 72hours after test completion. If any changes need to be made, you will be notified at that same time.  Medications reviewed and updated, no changes recommended at this time.  Please schedule followup in 12 months for annual exam and labs, call sooner if problems.

## 2014-08-20 NOTE — Assessment & Plan Note (Signed)
Lab Results  Component Value Date   HGBA1C 6.8* 02/19/2014  Dx same 2009 -  Began metformin 08/2012 due to rising a1c trend - S/p nutrition eval x 09/2012 for same Recheck a1c q80mo and adjust as needed Refuses statin - allg updated in 2015 to reflect intolerance Also reports ACEI caused cough per ENT - off same - allg list also updated The patient is asked to make an attempt to improve diet and exercise patterns to aid in medical management of this problem.

## 2014-08-20 NOTE — Progress Notes (Signed)
Pre visit review using our clinic review tool, if applicable. No additional management support is needed unless otherwise documented below in the visit note. 

## 2014-08-20 NOTE — Progress Notes (Signed)
Subjective:    Patient ID: Alexis Shields, female    DOB: Jun 13, 1956, 58 y.o.   MRN: [id number]  HPI  patient is here today for annual physical. Patient feels well and has no complaints.  Also reviewed chronic medical issues and interval medical events  Past Medical History  Diagnosis Date  . Back pain   . Anemia   . Overweight (BMI 25.0-29.9)   . GERD (gastroesophageal reflux disease)   . Hypertension   . Type 2 diabetes mellitus    Family History  Problem Relation Age of Onset  . Kidney failure Mother   . Alcohol abuse Father   . Liver disease Father    History  Substance Use Topics  . Smoking status: Never Smoker   . Smokeless tobacco: Not on file  . Alcohol Use: No     Comment: Socially - 1x/mo    Review of Systems  Constitutional: Negative for fever, fatigue and unexpected weight change.  Respiratory: Negative for cough, shortness of breath and wheezing.   Cardiovascular: Negative for chest pain, palpitations and leg swelling.  Gastrointestinal: Negative for nausea, abdominal pain and diarrhea.  Neurological: Positive for syncope (seen at [facility] ED for same (07/2014 x 1 event) = vasovagal). Negative for dizziness, weakness, light-headedness and headaches.  Psychiatric/Behavioral: Negative for dysphoric mood. The patient is not nervous/anxious.   All other systems reviewed and are negative.      Objective:   Physical Exam  BP 140/70 mmHg  Pulse 77  Temp(Src) 98.4 F (36.9 C) (Oral)  Wt 188 lb (85.276 kg)  SpO2 97% Wt Readings from Last 3 Encounters:  08/20/14 188 lb (85.276 kg)  02/19/14 184 lb (83.462 kg)  08/21/13 185 lb 9.6 oz (84.188 kg)   Constitutional: She appears well-developed and well-nourished. No distress.  HENT: Head: Normocephalic and atraumatic. Ears: B TMs ok, no erythema or effusion; Nose: Nose normal. Mouth/Throat: Oropharynx is clear and moist. No oropharyngeal exudate.  Eyes: Conjunctivae and EOM are normal. Pupils are  equal, round, and reactive to light. No scleral icterus.  Neck: Normal range of motion. Neck supple. No JVD present. No thyromegaly present.  Cardiovascular: Normal rate, regular rhythm and normal heart sounds.  No murmur heard. No BLE edema. Pulmonary/Chest: Effort normal and breath sounds normal. No respiratory distress. She has no wheezes.  Abdominal: Soft. Bowel sounds are normal. She exhibits no distension. There is no tenderness. no masses GU/breast: defer to gyn Musculoskeletal: Normal range of motion, no joint effusions. No gross deformities Neurological: She is alert and oriented to person, place, and time. No cranial nerve deficit. Coordination, balance, strength, speech and gait are normal.  Skin: Skin is warm and dry. No rash noted. No erythema.  Psychiatric: She has a normal mood and affect. Her behavior is normal. Judgment and thought content normal.    Lab Results  Component Value Date   WBC 6.6 06/19/2013   HGB 13.5 06/19/2013   HCT 43.1 06/19/2013   PLT 251.0 08/29/2012   GLUCOSE 125* 02/19/2014   CHOL 218* 02/19/2014   TRIG 86.0 02/19/2014   HDL 53.50 02/19/2014   LDLDIRECT 153.3 08/29/2012   LDLCALC 147* 02/19/2014   ALT 15 02/19/2014   AST 18 02/19/2014   NA 140 02/19/2014   K 4.4 02/19/2014   CL 106 02/19/2014   CREATININE 0.8 02/19/2014   BUN 8 02/19/2014   CO2 27 02/19/2014   TSH 1.05 02/19/2014   HGBA1C 6.8* 02/19/2014   MICROALBUR 1.3 02/19/2014  Dg Chest 2 View  08/21/2013   CLINICAL DATA:  Shortness of breath, chest pain.  EXAM: CHEST  2 VIEW  COMPARISON:  None.  FINDINGS: The heart size and mediastinal contours are within normal limits. Both lungs are clear. The visualized skeletal structures are unremarkable.  IMPRESSION: No active cardiopulmonary disease.   Electronically Signed   By: Kathreen Devoid   On: 08/21/2013 12:29       Assessment & Plan:   CPX/z00.00 - Patient has been counseled on age-appropriate routine health concerns for  screening and prevention. These are reviewed and up-to-date. Immunizations are up-to-date or declined. Labs ordered and reviewed.  Problem List Items Addressed This Visit    Type 2 diabetes mellitus    Lab Results  Component Value Date   HGBA1C 6.8* 02/19/2014  Dx same 2009 -  Began metformin 08/2012 due to rising a1c trend - S/p nutrition eval x 09/2012 for same Recheck a1c q59mo and adjust as needed Refuses statin - allg updated in 2015 to reflect intolerance Also reports ACEI caused cough per ENT - off same - allg list also updated The patient is asked to make an attempt to improve diet and exercise patterns to aid in medical management of this problem.    Relevant Medications      metFORMIN (GLUCOPHAGE-XR) 24 hr tablet   Other Relevant Orders      Hemoglobin A1c    Other Visit Diagnoses    Routine general medical examination at a health care facility    -  Primary

## 2014-09-05 ENCOUNTER — Other Ambulatory Visit: Payer: Self-pay | Admitting: Internal Medicine

## 2014-10-07 ENCOUNTER — Other Ambulatory Visit: Payer: Self-pay | Admitting: Internal Medicine

## 2014-11-06 ENCOUNTER — Telehealth: Payer: Self-pay | Admitting: Internal Medicine

## 2014-11-06 NOTE — Telephone Encounter (Signed)
LVM for pt to call back.

## 2014-11-06 NOTE — Telephone Encounter (Signed)
Rodell called from [place] wanting to verify insuline treatment for Mrs. Alexis Shields. Please give them call back, they provide diabetic testing supply for pt

## 2014-11-07 NOTE — Telephone Encounter (Signed)
Pt returned call

## 2014-11-08 ENCOUNTER — Other Ambulatory Visit: Payer: Self-pay

## 2014-11-08 NOTE — Telephone Encounter (Signed)
I was just able to get with the pt regarding the below note. Pt states that she is not using any pharmacy other [facility] at this moment. She is not test her glucose and she has not authorized anyone to provide diabetic testing supplies.

## 2014-12-07 ENCOUNTER — Telehealth: Payer: Self-pay | Admitting: Internal Medicine

## 2014-12-19 ENCOUNTER — Ambulatory Visit (INDEPENDENT_AMBULATORY_CARE_PROVIDER_SITE_OTHER): Payer: BLUE CROSS/BLUE SHIELD | Admitting: Family Medicine

## 2014-12-19 ENCOUNTER — Encounter: Payer: Self-pay | Admitting: Family Medicine

## 2014-12-19 VITALS — BP 124/78 | HR 96 | Temp 98.6°F | Resp 16 | Ht 66.5 in | Wt 185.6 lb

## 2014-12-19 DIAGNOSIS — I10 Essential (primary) hypertension: Secondary | ICD-10-CM

## 2014-12-19 DIAGNOSIS — E1140 Type 2 diabetes mellitus with diabetic neuropathy, unspecified: Secondary | ICD-10-CM

## 2014-12-19 DIAGNOSIS — E785 Hyperlipidemia, unspecified: Secondary | ICD-10-CM | POA: Diagnosis not present

## 2014-12-19 DIAGNOSIS — S335XXD Sprain of ligaments of lumbar spine, subsequent encounter: Secondary | ICD-10-CM | POA: Diagnosis not present

## 2014-12-19 DIAGNOSIS — Z1239 Encounter for other screening for malignant neoplasm of breast: Secondary | ICD-10-CM | POA: Diagnosis not present

## 2014-12-19 LAB — COMPREHENSIVE METABOLIC PANEL
ALBUMIN: 4.5 g/dL (ref 3.5–5.2)
ALT: 16 U/L (ref 0–35)
AST: 16 U/L (ref 0–37)
Alkaline Phosphatase: 76 U/L (ref 39–117)
BUN: 10 mg/dL (ref 6–23)
CALCIUM: 10.2 mg/dL (ref 8.4–10.5)
CO2: 23 meq/L (ref 19–32)
Chloride: 105 mEq/L (ref 96–112)
Creat: 0.76 mg/dL (ref 0.50–1.10)
Glucose, Bld: 121 mg/dL — ABNORMAL HIGH (ref 70–99)
Potassium: 4.1 mEq/L (ref 3.5–5.3)
SODIUM: 141 meq/L (ref 135–145)
Total Bilirubin: 0.4 mg/dL (ref 0.2–1.2)
Total Protein: 7.7 g/dL (ref 6.0–8.3)

## 2014-12-19 LAB — CBC WITH DIFFERENTIAL/PLATELET
Basophils Absolute: 0 10*3/uL (ref 0.0–0.1)
Basophils Relative: 0 % (ref 0–1)
EOS PCT: 2 % (ref 0–5)
Eosinophils Absolute: 0.1 10*3/uL (ref 0.0–0.7)
HCT: 41.5 % (ref 36.0–46.0)
HEMOGLOBIN: 13.9 g/dL (ref 12.0–15.0)
LYMPHS PCT: 38 % (ref 12–46)
Lymphs Abs: 2 10*3/uL (ref 0.7–4.0)
MCH: 27 pg (ref 26.0–34.0)
MCHC: 33.5 g/dL (ref 30.0–36.0)
MCV: 80.7 fL (ref 78.0–100.0)
MONO ABS: 0.3 10*3/uL (ref 0.1–1.0)
MONOS PCT: 6 % (ref 3–12)
MPV: 9.7 fL (ref 8.6–12.4)
Neutro Abs: 2.8 10*3/uL (ref 1.7–7.7)
Neutrophils Relative %: 54 % (ref 43–77)
PLATELETS: 307 10*3/uL (ref 150–400)
RBC: 5.14 MIL/uL — AB (ref 3.87–5.11)
RDW: 14.1 % (ref 11.5–15.5)
WBC: 5.2 10*3/uL (ref 4.0–10.5)

## 2014-12-19 LAB — LIPID PANEL
Cholesterol: 230 mg/dL — ABNORMAL HIGH (ref 0–200)
HDL: 57 mg/dL (ref >=46)
LDL Cholesterol: 156 mg/dL — ABNORMAL HIGH (ref 0–99)
Total CHOL/HDL Ratio: 4 Ratio
Triglycerides: 86 mg/dL (ref <150)
VLDL: 17 mg/dL (ref 0–40)

## 2014-12-19 LAB — HEMOGLOBIN A1C
Hgb A1c MFr Bld: 7 % — ABNORMAL HIGH (ref <5.7)
MEAN PLASMA GLUCOSE: 154 mg/dL — AB (ref <117)

## 2014-12-19 MED ORDER — AMLODIPINE BESYLATE 10 MG PO TABS: 10.0000 mg | ORAL_TABLET | Freq: Every day | ORAL | Status: DC

## 2014-12-19 MED ORDER — CYCLOBENZAPRINE HCL 5 MG PO TABS: 5.0000 mg | ORAL_TABLET | Freq: Three times a day (TID) | ORAL | Status: DC | PRN

## 2014-12-19 MED ORDER — HYDROCODONE-ACETAMINOPHEN 5-325 MG PO TABS: 1.0000 | ORAL_TABLET | Freq: Four times a day (QID) | ORAL | Status: DC | PRN

## 2014-12-19 MED ORDER — METFORMIN HCL ER 500 MG PO TB24: 500.0000 mg | ORAL_TABLET | Freq: Every day | ORAL | Status: DC

## 2014-12-19 MED ORDER — NAPROXEN 500 MG PO TABS: 500.0000 mg | ORAL_TABLET | Freq: Two times a day (BID) | ORAL | Status: DC

## 2014-12-19 NOTE — Progress Notes (Signed)
Subjective:    Patient ID: Alexis Shields, female    DOB: October 02, 1955, 59 y.o.   MRN: [id number]  12/19/2014  Establish Care and Medication Refill   HPI This 59 y.o. female presents to establish care and for four month follow-up:  1. DMII:  Patient reports good compliance with medication, good tolerance to medication, and good symptom control.  Stopped Metformin for six months in 2015; restarted medication because aunt got onto patient; restarted Metformin in June 2015.  Does not check sugars regularly.  S/p diabetic education and nutrition counseling.  Still paying that bill.  Drinking Mt. Dew once per week.  Does not like diet Mt. Dew.    2. Hypertension:  Taking Amlodipine daily; cut out salt cold [place] and started having severe cramps in arms and legs.  Not taking Lisinopril due to cough.  Denies CP/palp/SOB/leg swelling.  3.  Hyperlipidemia: not taking Lipitor; refuses to take it.  Previous PCP did not test cholesterol; pt got mad due to not testing cholesterol.  Recent cholesterol level normal at [facility] unit.     4. ?R Breast cancer:  Lumpectomy; no radiation?  [facility] Allegra.  2010.  No chemotherapy.    5. Health Maintenance:   Last physical:  08-20-2014 [facility] Mammogram:  09-12-2012; [facility] Colonoscopy:  01-15-2009; repeat in ten years. Bone density:  Never. TDAP:  2013 Pneumovax:  Never; refuses Zostavax: never; refuses Influenza:  refuses Eye exam:  Dr. Alois Cliche; 2015; no diabetic retinopathy.  +readers Dental exam:  Every six months.  Peridontist.  6. Recurrent lower back pain: intermittent issues; usually presents to [facility] for treatment with good results; pain will usually last for less than one week.  No current pain but requesting refills of medications to use when back pain recurs.  Naproxen, Flexeril, hydrocodone work well.     Review of Systems  Constitutional: Negative for fever, chills,  diaphoresis and fatigue.  Eyes: Negative for visual disturbance.  Respiratory: Negative for cough and shortness of breath.   Cardiovascular: Negative for chest pain, palpitations and leg swelling.  Gastrointestinal: Negative for nausea, vomiting, abdominal pain, diarrhea and constipation.  Endocrine: Negative for cold intolerance, heat intolerance, polydipsia, polyphagia and polyuria.  Musculoskeletal: Positive for back pain.  Skin: Negative for color change, pallor, rash and wound.  Neurological: Negative for dizziness, tremors, seizures, syncope, facial asymmetry, speech difficulty, weakness, light-headedness, numbness and headaches.    Past Medical History  Diagnosis Date  . Back pain   . Anemia   . Overweight (BMI 25.0-29.9)   . GERD (gastroesophageal reflux disease)   . Hypertension   . Type 2 diabetes mellitus    Past Surgical History  Procedure Laterality Date  . Breast lumpectomy      R breast   Allergies  Allergen Reactions  . Fexofenadine Hcl   . Influenza Vaccines   . Lisinopril Cough  . Statins Other (See Comments)    Pt refuses   Current Outpatient Prescriptions  Medication Sig Dispense Refill  . amLODipine (NORVASC) 10 MG tablet Take 1 tablet (10 mg total) by mouth daily. 90 tablet 3  . metFORMIN (GLUCOPHAGE-XR) 500 MG 24 hr tablet Take 1 tablet (500 mg total) by mouth daily with breakfast. 90 tablet 3  . atorvastatin (LIPITOR) 10 MG tablet TAKE 1 TABLET DAILY (Patient not taking: Reported on 12/19/2014) 90 tablet 3  . lisinopril (PRINIVIL,ZESTRIL) 20 MG tablet TAKE 1 TABLET DAILY (Patient not  taking: Reported on 12/19/2014) 90 tablet 3   No current facility-administered medications for this visit.   History   Social History  . Marital Status: Married    Spouse Name: N/A  . Number of Children: N/A  . Years of Education: N/A   Occupational History  . Not on file.   Social History Main Topics  . Smoking status: Never Smoker   . Smokeless tobacco: Not  on file  . Alcohol Use: No     Comment: Socially - 1x/mo  . Drug Use: No  . Sexual Activity: Not on file   Other Topics Concern  . 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 Kidney failure Mother   . Alcohol abuse Father   . Liver disease Father   . Lupus Sister        Objective:    BP 124/78 mmHg  Pulse 96  Temp(Src) 98.6 F (37 C) (Oral)  Resp 16  Ht 5' 6.5" (1.689 m)  Wt 185 lb 9.6 oz (84.188 kg)  BMI 29.51 kg/m2  SpO2 100% Physical Exam  Constitutional: She is oriented to person, place, and time. She appears well-developed and well-nourished. No distress.  HENT:  Head: Normocephalic and atraumatic.  Right Ear: External ear normal.  Left Ear: External ear normal.  Nose: Nose normal.  Mouth/Throat: Oropharynx is clear and moist.  Eyes: Conjunctivae and EOM are normal. Pupils are equal, round, and reactive to light.  Neck: Normal range of motion. Neck supple. Carotid bruit is not present. No thyromegaly present.  Cardiovascular: Normal rate, regular rhythm, normal heart sounds and intact distal pulses.  Exam reveals no gallop and no friction rub.   No murmur heard. Pulmonary/Chest: Effort normal and breath sounds normal. She has no wheezes. She has no rales.  Abdominal: Soft. Bowel sounds are normal. She exhibits no distension and no mass. There is no tenderness. There is no rebound and no guarding.  Musculoskeletal:       Lumbar back: She exhibits normal range of motion, no tenderness, no bony tenderness, no pain, no spasm and normal pulse.  Lymphadenopathy:    She has no cervical adenopathy.  Neurological: She is alert and oriented to person, place, and time. No cranial  nerve deficit.  Skin: Skin is warm and dry. No rash noted. She is not diaphoretic. No erythema. No pallor.  Psychiatric: She has a normal mood and affect. Her behavior is normal.   Results for orders placed or performed in visit on 08/20/14  Hemoglobin A1c  Result Value Ref Range   Hgb A1c MFr Bld 7.3 (H) 4.6 - 6.5 %       Assessment & Plan:   1. DM neuropathy, type II diabetes mellitus   2. Essential hypertension, benign   3. Hyperlipidemia   4. Breast cancer screening   5. Lumbar sprain, subsequent encounter     1.  DMII: moderately controlled; obtain labs; refill provided.  Recommend weight loss, exercise, dietary modification. 2. HTN: controlled; intolerant to Lisinopril due to  cough; consider ARB in future.  Obtain labs; refill provided. 3.  Hyperlipidemia: uncontrolled due to non-compliance to Lipitor; obtain labs. 4.  Lumbar sprain: stable at this time; refill of Naproxen,Flexeril, Hydrocodone provided. Recommend regular exercise, weight loss, low back exercises. 5.  Breast cancer screening: refer for mammogram; obtain records regarding R breast mass in past.      Meds ordered this encounter  Medications  . amLODipine (NORVASC) 10 MG tablet    Sig: Take 1 tablet (10 mg total) by mouth daily.    Dispense:  90 tablet    Refill:  3  . metFORMIN (GLUCOPHAGE-XR) 500 MG 24 hr tablet    Sig: Take 1 tablet (500 mg total) by mouth daily with breakfast.    Dispense:  90 tablet    Refill:  3    No Follow-up on file.    Dora Simeone Elayne Guerin, M.D. [facility] [address] [place], [place]  [address] [phone] phone [phone] fax

## 2014-12-19 NOTE — Patient Instructions (Signed)

## 2014-12-20 ENCOUNTER — Encounter: Payer: Self-pay | Admitting: Family Medicine

## 2015-01-02 ENCOUNTER — Other Ambulatory Visit: Payer: Self-pay | Admitting: Family Medicine

## 2015-01-02 DIAGNOSIS — Z1231 Encounter for screening mammogram for malignant neoplasm of breast: Secondary | ICD-10-CM

## 2015-01-07 ENCOUNTER — Ambulatory Visit
Admission: RE | Admit: 2015-01-07 | Discharge: 2015-01-07 | Disposition: A | Discharge status: Home / Self Care | Payer: BLUE CROSS/BLUE SHIELD | Source: Ambulatory Visit | Attending: Family Medicine | Admitting: Family Medicine

## 2015-01-07 DIAGNOSIS — Z1231 Encounter for screening mammogram for malignant neoplasm of breast: Secondary | ICD-10-CM

## 2015-02-06 ENCOUNTER — Ambulatory Visit: Payer: BC Managed Care – PPO | Admitting: Internal Medicine

## 2015-02-28 ENCOUNTER — Ambulatory Visit (INDEPENDENT_AMBULATORY_CARE_PROVIDER_SITE_OTHER): Payer: BLUE CROSS/BLUE SHIELD | Admitting: Family Medicine

## 2015-02-28 ENCOUNTER — Telehealth: Payer: Self-pay | Admitting: Family Medicine

## 2015-02-28 VITALS — BP 130/78 | HR 88 | Temp 97.6°F | Resp 17 | Ht 67.5 in | Wt 192.0 lb

## 2015-02-28 DIAGNOSIS — R079 Chest pain, unspecified: Secondary | ICD-10-CM | POA: Diagnosis not present

## 2015-02-28 DIAGNOSIS — R42 Dizziness and giddiness: Secondary | ICD-10-CM | POA: Diagnosis not present

## 2015-02-28 DIAGNOSIS — N390 Urinary tract infection, site not specified: Secondary | ICD-10-CM | POA: Diagnosis not present

## 2015-02-28 DIAGNOSIS — R112 Nausea with vomiting, unspecified: Secondary | ICD-10-CM | POA: Diagnosis not present

## 2015-02-28 DIAGNOSIS — H8112 Benign paroxysmal vertigo, left ear: Secondary | ICD-10-CM | POA: Diagnosis not present

## 2015-02-28 LAB — POCT URINALYSIS DIPSTICK
Bilirubin, UA: NEGATIVE
Blood, UA: NEGATIVE
Glucose, UA: NEGATIVE
Ketones, UA: NEGATIVE
Nitrite, UA: NEGATIVE
Spec Grav, UA: 1.02
Urobilinogen, UA: 0.2
pH, UA: 6.5

## 2015-02-28 LAB — POCT UA - MICROSCOPIC ONLY
Casts, Ur, LPF, POC: NEGATIVE
Crystals, Ur, HPF, POC: NEGATIVE
Mucus, UA: NEGATIVE
Yeast, UA: NEGATIVE

## 2015-02-28 LAB — TROPONIN I: Troponin I: <0.01 ng/mL (ref <0.06)

## 2015-02-28 LAB — POCT CBC
Granulocyte percent: 76.9 %G (ref 37–80)
HCT, POC: 41.8 % (ref 37.7–47.9)
Hemoglobin: 13.5 g/dL (ref 12.2–16.2)
Lymph, poc: 1.4 (ref 0.6–3.4)
MCH, POC: 26 pg — AB (ref 27–31.2)
MCHC: 32.3 g/dL (ref 31.8–35.4)
MCV: 80.4 fL (ref 80–97)
MID (cbc): 0.2 (ref 0–0.9)
MPV: 8.1 fL (ref 0–99.8)
POC Granulocyte: 5.2 (ref 2–6.9)
POC LYMPH PERCENT: 20.7 % (ref 10–50)
POC MID %: 2.4 %M (ref 0–12)
Platelet Count, POC: 260 10*3/uL (ref 142–424)
RBC: 5.19 M/uL (ref 4.04–5.48)
RDW, POC: 14 %
WBC: 6.8 10*3/uL (ref 4.6–10.2)

## 2015-02-28 LAB — GLUCOSE, POCT (MANUAL RESULT ENTRY): POC Glucose: 170 mg/dL — AB (ref 70–99)

## 2015-02-28 MED ORDER — CEPHALEXIN 500 MG PO CAPS: 500.0000 mg | ORAL_CAPSULE | Freq: Two times a day (BID) | ORAL | Status: DC

## 2015-02-28 MED ORDER — ONDANSETRON 4 MG PO TBDP: 4.0000 mg | ORAL_TABLET | Freq: Three times a day (TID) | ORAL | Status: DC | PRN

## 2015-02-28 MED ORDER — MECLIZINE HCL 25 MG PO TABS: 25.0000 mg | ORAL_TABLET | Freq: Three times a day (TID) | ORAL | Status: DC | PRN

## 2015-02-28 NOTE — Progress Notes (Signed)
Chief Complaint:  Chief Complaint  Patient presents with  . Ear Pain    left ear     HPI: Alexis Shields is a 59 y.o. female who is here for  history of dizziness. She thought it was going to get better but it has gotten worse. She feels there is some pressure and pulsation in her left ear. She has had recent episodes of nausea and vomiting. This has been only in the last 2 days. 2 days ago she also experienced 2 episodes of sharp chest pain she currently is chest pain-free. It is midepigastric and was sharp and lasted about 10 minutes. Spontaneous resolution. There is no radiation. She has denies any visual changes, headaches, palpitations, shortness of breath, numbness, weakness, tingling, slurred speech. She has had vertigo in the remote past. This feels similar but worse. She does have reflux symptoms and has not eaten because she feels nauseated from the dizziness. Every time she moves her head or neck she has dizziness. No photophobia or noise sensitivity. She denies any rashes. No neck pain.    Past Medical History  Diagnosis Date  . Back pain   . Anemia   . Overweight (BMI 25.0-29.9)   . GERD (gastroesophageal reflux disease)   . Hypertension   . Type 2 diabetes mellitus   . Hyperlipidemia    Past Surgical History  Procedure Laterality Date  . Breast lumpectomy      R breast   History   Social History  . Marital Status: Married    Spouse Name: N/A  . Number of Children: N/A  . Years of Education: N/A   Social History Main Topics  . Smoking status: Never Smoker   . Smokeless tobacco: Not on file  . Alcohol Use: No     Comment: Socially - 1x/mo  . Drug Use: No  . Sexual Activity: Not on file   Other Topics Concern  . 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 Kidney failure Mother   . Alcohol abuse Father   . Liver disease Father   . Lupus Sister    Allergies  Allergen Reactions  . Fexofenadine Hcl   . Influenza Vaccines   . Lisinopril Cough  . Statins Other (See Comments)    Pt refuses   Prior to Admission medications   Medication Sig Start Date End Date Taking? Authorizing Provider  amLODipine (NORVASC) 10 MG tablet Take 1 tablet (10 mg total) by mouth daily. 12/19/14  Yes Wardell Honour, MD  metFORMIN (GLUCOPHAGE-XR) 500 MG 24 hr tablet Take 1 tablet (500 mg total) by mouth daily with breakfast. 12/19/14  Yes Wardell Honour, MD  atorvastatin (LIPITOR) 10 MG tablet TAKE 1 TABLET DAILY Patient not taking: Reported on 12/19/2014 09/05/14   Rowe Clack, MD  lisinopril (PRINIVIL,ZESTRIL) 20 MG tablet TAKE 1 TABLET DAILY Patient not taking: Reported on 12/19/2014 09/05/14   Rowe Clack, MD     ROS: The patient denies fevers, chills, night sweats,  unintentional weight loss, chest pain, palpitations, wheezing, dyspnea on exertion, nausea, vomiting, abdominal pain, dysuria, hematuria, melena, numbness, weakness, or tingling.  All other systems have been reviewed and were otherwise negative with the exception of those mentioned in the HPI and as above.    PHYSICAL EXAM: Filed Vitals:   02/28/15 1229  BP: 130/78  Pulse: 88  Temp: 97.6 F (36.4 C)  Resp: 17   Filed Vitals:   02/28/15 1229  Height: 5' 7.5" (1.715 m)  Weight: 192 lb (87.091 kg)   Body mass index is 29.61 kg/(m^2).   General: Alert, no acute distress HEENT:  Normocephalic, atraumatic, oropharynx patent. EOMI, PERRLA Cardiovascular:  Regular rate and rhythm, no rubs murmurs or gallops.  No Carotid bruits, radial pulse intact. No pedal edema.  Tympanic membrane normal Respiratory: Clear to auscultation bilaterally.  No wheezes, rales, or rhonchi.  No  cyanosis, no use of accessory musculature GI: No organomegaly, abdomen is soft and non-tender, positive bowel sounds.  No masses. Skin: No rashes. Neurologic: Facial musculature symmetric. Cranial nerves II-12 grossly intact. No nystagmus. She has significant dizziness with flexion and extension of her neck and also rotation of her neck. However she has minimal Cerebellar function intact with finger to nose Psychiatric: Patient is appropriate throughout our interaction. Lymphatic: No cervical lymphadenopathy Musculoskeletal: Gait intact. 5 out of 5 strength in upper and lower extremities. 2 out of 2 DTRs.   LABS: Results for orders placed or performed in visit on 02/28/15  POCT CBC  Result Value Ref Range   WBC 6.8 4.6 - 10.2 K/uL   Lymph, poc 1.4 0.6 - 3.4   POC LYMPH PERCENT 20.7 10 - 50 %L   MID (cbc) 0.2 0 - 0.9   POC MID % 2.4 0 - 12 %M   POC Granulocyte 5.2 2 - 6.9   Granulocyte percent 76.9 37 - 80 %G   RBC 5.19 4.04 - 5.48 M/uL   Hemoglobin 13.5 12.2 - 16.2 g/dL   HCT, POC 41.8 37.7 - 47.9 %   MCV 80.4 80 - 97 fL   MCH, POC 26.0 (A) 27 - 31.2 pg   MCHC 32.3 31.8 - 35.4 g/dL   RDW, POC 14.0 %   Platelet Count, POC 260 142 - 424 K/uL   MPV 8.1 0 - 99.8 fL  POCT UA - Microscopic Only  Result Value Ref Range   WBC, Ur, HPF, POC 3-7    RBC, urine, microscopic 0-2    Bacteria, U Microscopic trace    Mucus, UA neg    Epithelial cells, urine per micros 1-3    Crystals, Ur, HPF, POC neg    Casts, Ur, LPF, POC neg    Yeast, UA neg   POCT urinalysis dipstick  Result Value Ref Range   Color, UA yellow    Clarity, UA clear    Glucose, UA neg    Bilirubin, UA neg    Ketones, UA neg    Spec Grav, UA 1.020    Blood, UA neg    pH, UA 6.5    Protein, UA trace    Urobilinogen, UA 0.2    Nitrite, UA neg    Leukocytes, UA small (1+) (A) Negative     EKG/XRAY:   Primary read interpreted by Dr. Marin Comment at [facility]. EKG WNL no changes   ASSESSMENT/PLAN: Encounter Diagnoses    Name Primary?  . Non-intractable vomiting with nausea, vomiting of unspecified type Yes  . Benign paroxysmal positional vertigo,  left   . Dizziness and giddiness   . Chest pain, unspecified chest pain type    This is a pleasant 59 year old African-American female with a past medical history of hypertension, GERD, type 2 diabetes and remote history of vertigo who presents with a 2 day history of dizziness and also GERD-like symptoms. She has dizziness only with movement of her head. Labs were normal in the office, EKG was reassuring. She will get a stat troponin and its most likely just GERD since she has not been eating due to the nausea from the dizziness. I've given her precautions to the ER as needed and have also someone to monitor her while she is at home alone. She was prescribed meclizine and Zofran Follow-up as needed  Gross sideeffects, risk and benefits, and alternatives of medications d/w patient. Patient is aware that all medications have potential sideeffects and we are unable to predict every sideeffect or drug-drug interaction that may occur.  LE, Gillespie, DO 02/28/2015 2:30 PM

## 2015-02-28 NOTE — Patient Instructions (Signed)

## 2015-02-28 NOTE — Telephone Encounter (Signed)
Patient states that her equilibrium is off. She states that she can hear her heart beat in her left ear. She wants medicine sent to [facility] on [address] (asked her 2 times if she meant drive or parkway and she said drive).   [phone]

## 2015-02-28 NOTE — Telephone Encounter (Signed)
Spoke with pt, she states she is very dizzy and she hears her pulse in her left ear for about a week. I advised pt to come in to be seen. Pt agreed.

## 2015-03-01 LAB — COMPLETE METABOLIC PANEL WITH GFR
ALT: 14 U/L (ref 0–35)
Albumin: 4.3 g/dL (ref 3.5–5.2)
Alkaline Phosphatase: 79 U/L (ref 39–117)
CO2: 28 mEq/L (ref 19–32)
Creat: 0.7 mg/dL (ref 0.50–1.10)
GFR, Est Non African American: >89 mL/min
Glucose, Bld: 184 mg/dL — ABNORMAL HIGH (ref 70–99)
Potassium: 4.4 mEq/L (ref 3.5–5.3)
Sodium: 142 mEq/L (ref 135–145)
Total Bilirubin: 0.3 mg/dL (ref 0.2–1.2)
Total Protein: 7.5 g/dL (ref 6.0–8.3)

## 2015-03-01 LAB — COMPLETE METABOLIC PANEL WITHOUT GFR
AST: 14 U/L (ref 0–37)
BUN: 10 mg/dL (ref 6–23)
Calcium: 10.1 mg/dL (ref 8.4–10.5)
Chloride: 103 meq/L (ref 96–112)
GFR, Est African American: >89 mL/min

## 2015-03-01 LAB — URINE CULTURE: Colony Count: 75000

## 2015-05-09 ENCOUNTER — Encounter: Payer: Self-pay | Admitting: Family Medicine

## 2015-06-12 LAB — HM DIABETES EYE EXAM

## 2015-06-24 ENCOUNTER — Encounter: Payer: Self-pay | Admitting: Family Medicine

## 2015-06-24 ENCOUNTER — Ambulatory Visit (INDEPENDENT_AMBULATORY_CARE_PROVIDER_SITE_OTHER): Payer: BLUE CROSS/BLUE SHIELD | Admitting: Family Medicine

## 2015-06-24 VITALS — BP 126/84 | HR 80 | Temp 98.8°F | Resp 16 | Ht 67.0 in | Wt 194.2 lb

## 2015-06-24 DIAGNOSIS — I10 Essential (primary) hypertension: Secondary | ICD-10-CM

## 2015-06-24 DIAGNOSIS — E785 Hyperlipidemia, unspecified: Secondary | ICD-10-CM

## 2015-06-24 DIAGNOSIS — E1142 Type 2 diabetes mellitus with diabetic polyneuropathy: Secondary | ICD-10-CM | POA: Diagnosis not present

## 2015-06-24 DIAGNOSIS — Z114 Encounter for screening for human immunodeficiency virus [HIV]: Secondary | ICD-10-CM

## 2015-06-24 DIAGNOSIS — Z1159 Encounter for screening for other viral diseases: Secondary | ICD-10-CM

## 2015-06-24 DIAGNOSIS — E7800 Pure hypercholesterolemia, unspecified: Secondary | ICD-10-CM | POA: Insufficient documentation

## 2015-06-24 LAB — CBC WITH DIFFERENTIAL/PLATELET
BASOS ABS: 0 10*3/uL (ref 0.0–0.1)
BASOS PCT: 1 % (ref 0–1)
EOS ABS: 0.1 10*3/uL (ref 0.0–0.7)
EOS PCT: 2 % (ref 0–5)
HCT: 40.3 % (ref 36.0–46.0)
Hemoglobin: 14 g/dL (ref 12.0–15.0)
LYMPHS ABS: 1.6 10*3/uL (ref 0.7–4.0)
Lymphocytes Relative: 36 % (ref 12–46)
MCH: 27.5 pg (ref 26.0–34.0)
MCHC: 34.7 g/dL (ref 30.0–36.0)
MCV: 79 fL (ref 78.0–100.0)
MPV: 9.8 fL (ref 8.6–12.4)
Monocytes Absolute: 0.3 10*3/uL (ref 0.1–1.0)
Monocytes Relative: 7 % (ref 3–12)
Neutro Abs: 2.4 10*3/uL (ref 1.7–7.7)
Neutrophils Relative %: 54 % (ref 43–77)
Platelets: 266 10*3/uL (ref 150–400)
RBC: 5.1 MIL/uL (ref 3.87–5.11)
RDW: 14.1 % (ref 11.5–15.5)
WBC: 4.4 10*3/uL (ref 4.0–10.5)

## 2015-06-24 LAB — LIPID PANEL
CHOL/HDL RATIO: 3.9 ratio (ref <=5.0)
CHOLESTEROL: 221 mg/dL — AB (ref 125–200)
HDL: 56 mg/dL (ref >=46)
LDL Cholesterol: 151 mg/dL — ABNORMAL HIGH (ref <130)
TRIGLYCERIDES: 68 mg/dL (ref <150)
VLDL: 14 mg/dL (ref <30)

## 2015-06-24 LAB — POCT URINALYSIS DIP (MANUAL ENTRY)
BILIRUBIN UA: NEGATIVE
Blood, UA: NEGATIVE
Glucose, UA: NEGATIVE
Ketones, POC UA: NEGATIVE
NITRITE UA: NEGATIVE
Protein Ur, POC: NEGATIVE
Spec Grav, UA: 1.015
UROBILINOGEN UA: 0.2
pH, UA: 6.5

## 2015-06-24 LAB — COMPREHENSIVE METABOLIC PANEL
ALBUMIN: 4.3 g/dL (ref 3.6–5.1)
ALT: 15 U/L (ref 6–29)
AST: 15 U/L (ref 10–35)
Alkaline Phosphatase: 78 U/L (ref 33–130)
BUN: 10 mg/dL (ref 7–25)
CALCIUM: 10 mg/dL (ref 8.6–10.4)
CO2: 28 mmol/L (ref 20–31)
CREATININE: 0.76 mg/dL (ref 0.50–1.05)
Chloride: 105 mmol/L (ref 98–110)
Glucose, Bld: 159 mg/dL — ABNORMAL HIGH (ref 65–99)
Potassium: 4.1 mmol/L (ref 3.5–5.3)
Sodium: 141 mmol/L (ref 135–146)
TOTAL PROTEIN: 7.3 g/dL (ref 6.1–8.1)
Total Bilirubin: 0.4 mg/dL (ref 0.2–1.2)

## 2015-06-24 LAB — HEMOGLOBIN A1C
Hgb A1c MFr Bld: 7.4 % — ABNORMAL HIGH (ref <5.7)
Mean Plasma Glucose: 166 mg/dL — ABNORMAL HIGH (ref <117)

## 2015-06-24 LAB — HIV ANTIBODY (ROUTINE TESTING W REFLEX): HIV: NONREACTIVE

## 2015-06-24 LAB — MICROALBUMIN, URINE: MICROALB UR: 0.5 mg/dL

## 2015-06-24 LAB — HEPATITIS C ANTIBODY: HCV AB: NEGATIVE

## 2015-06-24 MED ORDER — METFORMIN HCL ER 500 MG PO TB24: 1000.0000 mg | ORAL_TABLET | Freq: Every day | ORAL | Status: DC

## 2015-06-24 NOTE — Progress Notes (Signed)
Subjective:    Patient ID: Alexis Shields, female    DOB: [birth year], 59 y.o.   MRN: [id number]  06/24/2015  Follow-up   HPI This 59 y.o. female presents for four month follow-up:  1. DMII: HgbA1c 7.0 at last visit; increased Metformin to bid dosing.   Does not have a meter any longer.  Requesting supplies.  Diagnosed two years ago.  Did not increase Metformin to two daily.  Only eating one meal per day; very active in the yard.  No baby ASA daily.  2. Hypercholesterolemia: elevated at last visit.  Did not start Atorvastatin.  Rare fried foods; rare red meat.  Intolerant to cholesterol medication.  Very reluctant to start statin therapy.  LDL 150+ at last visit.  3. HTN:  Patient reports good compliance with medication, good tolerance to medication, and good symptom control.  Amlodipine 10mg  daily.  Some leg swelling.     4. Stress reaction: uncle had AMI last week and died at age 52 years old.  Neighbor's son age 73 died in 2015-05-03; cause of death unknown. Cousin died age 32; cousin was on HD.  Not interested in anxiety pills.  Husband is truck Geophysicist/field seismologist and gone all the time.  Son works at [employer].  Usually deals with things by myself.    5. L ankle: swelling intermittently; tries to avoid swelling. No varicose veins; history of sprained ankles in the past.  L knee injury in the past.  Knee never swells.  Took picture of ankle with swelling and without swelling. Usually swells intermittently.     Review of Systems  Constitutional: Negative for fever, chills, diaphoresis and fatigue.  Eyes: Negative for visual disturbance.  Respiratory: Negative for cough and shortness of breath.   Cardiovascular: Positive for leg swelling. Negative for chest pain and palpitations.  Gastrointestinal: Negative for nausea, vomiting, abdominal pain, diarrhea and constipation.  Endocrine: Negative for cold intolerance, heat intolerance, polydipsia, polyphagia and polyuria.  Musculoskeletal: Positive for  arthralgias.  Skin: Negative for color change, pallor, rash and wound.  Neurological: Negative for dizziness, tremors, seizures, syncope, facial asymmetry, speech difficulty, weakness, light-headedness, numbness and headaches.  Psychiatric/Behavioral: Positive for dysphoric mood. The patient is nervous/anxious.     Past Medical History  Diagnosis Date  . Back pain   . Anemia   . Overweight (BMI 25.0-29.9)   . GERD (gastroesophageal reflux disease)   . Hypertension   . Type 2 diabetes mellitus ([place])   . Hyperlipidemia    Past Surgical History  Procedure Laterality Date  . Breast lumpectomy      R breast   Allergies  Allergen Reactions  . Fexofenadine Hcl   . Influenza Vaccines   . Lisinopril Cough  . Statins Other (See Comments)    Pt refuses   Current Outpatient Prescriptions  Medication Sig Dispense Refill  . amLODipine (NORVASC) 10 MG tablet Take 1 tablet (10 mg total) by mouth daily. 90 tablet 3  . metFORMIN (GLUCOPHAGE-XR) 500 MG 24 hr tablet Take 2 tablets (1,000 mg total) by mouth daily with breakfast. 180 tablet 3   No current facility-administered medications for this visit.   Social History   Social History  . Marital Status: Married    Spouse Name: N/A  . Number of Children: N/A  . Years of Education: N/A   Occupational History  . Not on file.   Social History Main Topics  . Smoking status: Never Smoker   . Smokeless tobacco: Not on file  .  Alcohol Use: No     Comment: Socially - 1x/mo  . Drug Use: No  . Sexual Activity: Not on file   Other Topics Concern  . 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 Kidney failure Mother   . Alcohol abuse Father     . Liver disease Father   . Lupus Sister        Objective:    BP 126/84 mmHg  Pulse 80  Temp(Src) 98.8 F (37.1 C) (Oral)  Resp 16  Ht 5\' 7"  (1.702 m)  Wt 194 lb 3.2 oz (88.089 kg)  BMI 30.41 kg/m2 Physical Exam  Constitutional: She is oriented to person, place, and time. She appears well-developed and well-nourished. No distress.  HENT:  Head: Normocephalic and atraumatic.  Right Ear: External ear normal.  Left Ear: External ear normal.  Nose: Nose normal.  Mouth/Throat: Oropharynx is clear and moist.  Eyes: Conjunctivae and EOM are normal. Pupils are equal, round, and reactive to light.  Neck: Normal range of motion. Neck supple. Carotid bruit is not present. No thyromegaly present.  Cardiovascular: Normal rate, regular rhythm, normal heart sounds and intact distal pulses.  Exam reveals no gallop and no friction rub.   No murmur heard. +minimal lateral ankle swelling on L.   Pulmonary/Chest: Effort normal and breath sounds normal. She has no wheezes. She has no rales.  Abdominal: Soft. Bowel sounds are normal. She exhibits no distension and no mass. There is no tenderness. There is no rebound and no guarding.  Lymphadenopathy:    She has no cervical adenopathy.  Neurological: She is alert and oriented to person, place, and time. No cranial nerve deficit.  Skin: Skin is warm and dry. No rash noted. She is not diaphoretic. No erythema. No pallor.  Psychiatric: She has a normal mood and affect. Her behavior is normal.   Results for orders placed or performed in visit on 06/24/15  POCT urinalysis dipstick  Result Value Ref Range   Color, UA light yellow (A) yellow   Clarity, UA clear clear   Glucose, UA negative negative   Bilirubin, UA negative negative   Ketones, POC UA negative negative   Spec Grav, UA 1.015    Blood, UA negative negative   pH, UA 6.5    Protein Ur, POC negative negative   Urobilinogen, UA 0.2    Nitrite, UA Negative Negative   Leukocytes, UA small  (1+) (A) Negative       Assessment & Plan:   1. Type 2 diabetes mellitus with diabetic polyneuropathy, without long-term current use of insulin ([place])   2. Essential hypertension   3. Hyperlipidemia   4. Screening for HIV (human immunodeficiency virus)   5. Need for hepatitis C screening test     1. DMII: borderline control; obtain labs; increase Metformin ER to two tablets daily; s/p recent eye exam; refused Pneumovax and flu vaccine.  Pt refusing statin therapy; recommend starting ASA 81mg  daily. 2.  HTN: controlled; obtain labs; continue Amlodipine. 3. Hyperlipidemia: uncontrolled;  did not start statin after last visit; discussed current guidelines for diabetics; pt to consider statin therapy. 4. Screening HIV: obtain HIV. 5. Screening Hepatitis C: obtain.   Orders Placed This Encounter  Procedures  . Microalbumin, urine  . HIV antibody  . Hepatitis C antibody  . CBC with Differential/Platelet  . Comprehensive metabolic panel    Order Specific Question:  Has the patient fasted?    Answer:  Yes  . Hemoglobin A1c  . Lipid panel    Order Specific Question:  Has the patient fasted?    Answer:  Yes  . POCT urinalysis dipstick   Meds ordered this encounter  Medications  . metFORMIN (GLUCOPHAGE-XR) 500 MG 24 hr tablet    Sig: Take 2 tablets (1,000 mg total) by mouth daily with breakfast.    Dispense:  180 tablet    Refill:  3    Return in about 3 months (around 09/24/2015) for complete physical examiniation.    Kristi Elayne Guerin, M.D. [facility] [address] [place], [place]  [address] [phone] phone [phone] fax

## 2015-06-24 NOTE — Patient Instructions (Signed)
1.  START ASPIRIN 81MG  ONE TABLET DAILY FOR HEART ATTACK AND STROKE PREVENTION. 2.  RECOMMEND STARTING CHOLESTEROL MEDICATION FOR HEART ATTACK AND STROKE PREVENTION.  Basic Carbohydrate Counting for Diabetes Mellitus Carbohydrate counting is a method for keeping track of the amount of carbohydrates you eat. Eating carbohydrates naturally increases the level of sugar (glucose) in your blood, so it is important for you to know the amount that is okay for you to have in every meal. Carbohydrate counting helps keep the level of glucose in your blood within normal limits. The amount of carbohydrates allowed is different for every person. A dietitian can help you calculate the amount that is right for you. Once you know the amount of carbohydrates you can have, you can count the carbohydrates in the foods you want to eat. Carbohydrates are found in the following foods:  Grains, such as breads and cereals.  Dried beans and soy products.  Starchy vegetables, such as potatoes, peas, and corn.  Fruit and fruit juices.  Milk and yogurt.  Sweets and snack foods, such as cake, cookies, candy, chips, soft drinks, and fruit drinks. CARBOHYDRATE COUNTING There are two ways to count the carbohydrates in your food. You can use either of the methods or a combination of both. Reading the "Nutrition Facts" on [place] The "Nutrition Facts" is an area that is included on the labels of almost all packaged food and beverages in the [place]. It includes the serving size of that food or beverage and information about the nutrients in each serving of the food, including the grams (g) of carbohydrate per serving.  Decide the number of servings of this food or beverage that you will be able to eat or drink. Multiply that number of servings by the number of grams of carbohydrate that is listed on the label for that serving. The total will be the amount of carbohydrates you will be having when you eat or drink  this food or beverage. Learning Standard Serving Sizes of Food When you eat food that is not packaged or does not include "Nutrition Facts" on the label, you need to measure the servings in order to count the amount of carbohydrates.A serving of most carbohydrate-rich foods contains about 15 g of carbohydrates. The following list includes serving sizes of carbohydrate-rich foods that provide 15 g ofcarbohydrate per serving:   1 slice of bread (1 oz) or 1 six-inch tortilla.    of a hamburger bun or English muffin.  4-6 crackers.   cup unsweetened dry cereal.    cup hot cereal.   cup rice or pasta.    cup mashed potatoes or  of a large baked potato.  1 cup fresh fruit or one small piece of fruit.    cup canned or frozen fruit or fruit juice.  1 cup milk.   cup plain fat-free yogurt or yogurt sweetened with artificial sweeteners.   cup cooked dried beans or starchy vegetable, such as peas, corn, or potatoes.  Decide the number of standard-size servings that you will eat. Multiply that number of servings by 15 (the grams of carbohydrates in that serving). For example, if you eat 2 cups of strawberries, you will have eaten 2 servings and 30 g of carbohydrates (2 servings x 15 g = 30 g). For foods such as soups and casseroles, in which more than one food is mixed in, you will need to count the carbohydrates in each food that is included. EXAMPLE OF CARBOHYDRATE COUNTING Sample  Dinner  3 oz chicken breast.   cup of brown rice.   cup of corn.  1 cup milk.   1 cup strawberries with sugar-free whipped topping.  Carbohydrate Calculation Step 1: Identify the foods that contain carbohydrates:   Rice.   Corn.   Milk.   Strawberries. Step 2:Calculate the number of servings eaten of each:   2 servings of rice.   1 serving of corn.   1 serving of milk.   1 serving of strawberries. Step 3: Multiply each of those number of servings by 15 g:   2  servings of rice x 15 g = 30 g.   1 serving of corn x 15 g = 15 g.   1 serving of milk x 15 g = 15 g.   1 serving of strawberries x 15 g = 15 g. Step 4: Add together all of the amounts to find the total grams of carbohydrates eaten: 30 g + 15 g + 15 g + 15 g = 75 g.   This information is not intended to replace advice given to you by your health care provider. Make sure you discuss any questions you have with your health care provider.   Document Released: 08/24/2005 Document Revised: 09/14/2014 Document Reviewed: 07/21/2013 Elsevier Interactive Patient Education [employer].

## 2015-07-03 ENCOUNTER — Encounter: Payer: Self-pay | Admitting: Family Medicine

## 2015-07-11 MED ORDER — AMLODIPINE BESYLATE 10 MG PO TABS: 10.0000 mg | ORAL_TABLET | Freq: Every day | ORAL | Status: DC

## 2015-07-11 NOTE — Addendum Note (Signed)
Addended by: Wardell Honour on: 07/11/2015 10:21 PM   Modules accepted: Orders

## 2015-07-12 MED ORDER — BLOOD GLUCOSE MONITOR KIT: PACK | Status: DC

## 2015-07-12 NOTE — Addendum Note (Signed)
Addended by: Constance Goltz on: 07/12/2015 12:46 PM   Modules accepted: Orders

## 2015-08-06 ENCOUNTER — Other Ambulatory Visit: Payer: Self-pay

## 2015-08-06 MED ORDER — LANCETS MISC: Status: DC

## 2015-08-06 MED ORDER — GLUCOSE BLOOD VI STRP: ORAL_STRIP | Status: DC

## 2015-09-20 ENCOUNTER — Encounter: Payer: BLUE CROSS/BLUE SHIELD | Admitting: Family Medicine

## 2015-10-04 ENCOUNTER — Ambulatory Visit (INDEPENDENT_AMBULATORY_CARE_PROVIDER_SITE_OTHER): Payer: BLUE CROSS/BLUE SHIELD | Admitting: Family Medicine

## 2015-10-04 ENCOUNTER — Encounter: Payer: Self-pay | Admitting: Family Medicine

## 2015-10-04 VITALS — BP 119/80 | HR 79 | Temp 98.1°F | Resp 16 | Ht 66.75 in | Wt 188.0 lb

## 2015-10-04 DIAGNOSIS — K219 Gastro-esophageal reflux disease without esophagitis: Secondary | ICD-10-CM | POA: Diagnosis not present

## 2015-10-04 DIAGNOSIS — E785 Hyperlipidemia, unspecified: Secondary | ICD-10-CM

## 2015-10-04 DIAGNOSIS — F419 Anxiety disorder, unspecified: Secondary | ICD-10-CM

## 2015-10-04 DIAGNOSIS — E1142 Type 2 diabetes mellitus with diabetic polyneuropathy: Secondary | ICD-10-CM

## 2015-10-04 DIAGNOSIS — I10 Essential (primary) hypertension: Secondary | ICD-10-CM

## 2015-10-04 DIAGNOSIS — R102 Pelvic and perineal pain: Secondary | ICD-10-CM | POA: Diagnosis not present

## 2015-10-04 DIAGNOSIS — Z0000 Encounter for general adult medical examination without abnormal findings: Secondary | ICD-10-CM

## 2015-10-04 LAB — COMPREHENSIVE METABOLIC PANEL
ALK PHOS: 83 U/L (ref 33–130)
ALT: 17 U/L (ref 6–29)
AST: 15 U/L (ref 10–35)
Albumin: 4.2 g/dL (ref 3.6–5.1)
BILIRUBIN TOTAL: 0.4 mg/dL (ref 0.2–1.2)
BUN: 8 mg/dL (ref 7–25)
CALCIUM: 10 mg/dL (ref 8.6–10.4)
CO2: 28 mmol/L (ref 20–31)
Chloride: 102 mmol/L (ref 98–110)
Creat: 0.83 mg/dL (ref 0.50–1.05)
GLUCOSE: 127 mg/dL — AB (ref 65–99)
POTASSIUM: 4.3 mmol/L (ref 3.5–5.3)
Sodium: 141 mmol/L (ref 135–146)
TOTAL PROTEIN: 7.3 g/dL (ref 6.1–8.1)

## 2015-10-04 LAB — POCT URINALYSIS DIP (MANUAL ENTRY)
BILIRUBIN UA: NEGATIVE
Bilirubin, UA: NEGATIVE
Glucose, UA: NEGATIVE
Nitrite, UA: NEGATIVE
PH UA: 6.5
PROTEIN UA: NEGATIVE
SPEC GRAV UA: 1.02
UROBILINOGEN UA: 0.2

## 2015-10-04 LAB — LIPID PANEL
Cholesterol: 214 mg/dL — ABNORMAL HIGH (ref 125–200)
HDL: 59 mg/dL (ref >=46)
LDL CALC: 142 mg/dL — AB (ref <130)
TRIGLYCERIDES: 64 mg/dL (ref <150)
Total CHOL/HDL Ratio: 3.6 Ratio (ref <=5.0)
VLDL: 13 mg/dL (ref <30)

## 2015-10-04 LAB — CBC WITH DIFFERENTIAL/PLATELET
BASOS ABS: 0 10*3/uL (ref 0.0–0.1)
Basophils Relative: 0 % (ref 0–1)
EOS PCT: 2 % (ref 0–5)
Eosinophils Absolute: 0.1 10*3/uL (ref 0.0–0.7)
HEMATOCRIT: 40.9 % (ref 36.0–46.0)
Hemoglobin: 13.7 g/dL (ref 12.0–15.0)
LYMPHS ABS: 2.5 10*3/uL (ref 0.7–4.0)
LYMPHS PCT: 37 % (ref 12–46)
MCH: 27 pg (ref 26.0–34.0)
MCHC: 33.5 g/dL (ref 30.0–36.0)
MCV: 80.7 fL (ref 78.0–100.0)
MPV: 9.8 fL (ref 8.6–12.4)
Monocytes Absolute: 0.3 10*3/uL (ref 0.1–1.0)
Monocytes Relative: 5 % (ref 3–12)
NEUTROS PCT: 56 % (ref 43–77)
Neutro Abs: 3.8 10*3/uL (ref 1.7–7.7)
Platelets: 294 10*3/uL (ref 150–400)
RBC: 5.07 MIL/uL (ref 3.87–5.11)
RDW: 14.3 % (ref 11.5–15.5)
WBC: 6.7 10*3/uL (ref 4.0–10.5)

## 2015-10-04 LAB — POC MICROSCOPIC URINALYSIS (UMFC): Mucus: ABSENT

## 2015-10-04 LAB — TSH: TSH: 1.274 u[IU]/mL (ref 0.350–4.500)

## 2015-10-04 LAB — HEMOGLOBIN A1C
Hgb A1c MFr Bld: 7.2 % — ABNORMAL HIGH (ref <5.7)
Mean Plasma Glucose: 160 mg/dL — ABNORMAL HIGH (ref <117)

## 2015-10-04 MED ORDER — AMLODIPINE BESYLATE 10 MG PO TABS: 10.0000 mg | ORAL_TABLET | Freq: Every day | ORAL | Status: DC

## 2015-10-04 NOTE — Patient Instructions (Signed)

## 2015-10-04 NOTE — Progress Notes (Signed)
Subjective:    Patient ID: Alexis Shields, female    DOB: 1956-05-24, 60 y.o.   MRN: [id number]  10/04/2015  Annual Exam and Medication Refill   HPI This 60 y.o. female presents for Complete Physical Examination.  Last physical:  08-20-14 Pap smear:  09-07-2013; gynecologist/Vaishali Mody Mammogram:  01-08-2015 Colonoscopy:  01-07-2009; repeat in 10 years.   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 Eye exam:  06-12-15 Dental exam:  November 2016   Excessive worry; chronic; insomnia due to mind racing.  L pelvic pain.  Review of Systems  Constitutional: Negative for fever, chills, diaphoresis, activity change, appetite change, fatigue and unexpected weight change.  HENT: Negative for congestion, dental problem, drooling, ear discharge, ear pain, facial swelling, hearing loss, mouth sores, nosebleeds, postnasal drip, rhinorrhea, sinus pressure, sneezing, sore throat, tinnitus, trouble swallowing and voice change.   Eyes: Negative for photophobia, pain, discharge, redness, itching and visual disturbance.  Respiratory: Negative for apnea, cough, choking, chest tightness, shortness of breath, wheezing and stridor.   Cardiovascular: Negative for chest pain, palpitations and leg swelling.  Gastrointestinal: Negative for nausea, vomiting, abdominal pain, diarrhea, constipation, blood in stool, abdominal distention, anal bleeding and rectal pain.  Endocrine: Negative for cold intolerance, heat intolerance, polydipsia, polyphagia and polyuria.  Genitourinary: Negative for dysuria, urgency, frequency, hematuria, flank pain, decreased urine volume, vaginal bleeding, vaginal discharge, enuresis, difficulty urinating, genital sores, vaginal pain, menstrual problem, pelvic pain and dyspareunia.       L pelvic pain for past month; feels like menstrual cramp.  Musculoskeletal: Negative for myalgias, back pain, joint swelling, arthralgias,  gait problem, neck pain and neck stiffness.  Skin: Negative for color change, pallor, rash and wound.  Allergic/Immunologic: Negative for environmental allergies, food allergies and immunocompromised state.  Neurological: Negative for dizziness, tremors, seizures, syncope, facial asymmetry, speech difficulty, weakness, light-headedness, numbness and headaches.  Hematological: Negative for adenopathy. Does not bruise/bleed easily.  Psychiatric/Behavioral: Positive for sleep disturbance. Negative for suicidal ideas, hallucinations, behavioral problems, confusion, self-injury, dysphoric mood, decreased concentration and agitation. The patient is not nervous/anxious and is not hyperactive.        Mind is racing    Past Medical History  Diagnosis Date  . Back pain   . Anemia   . Overweight (BMI 25.0-29.9)   . GERD (gastroesophageal reflux disease)   . Hypertension   . Type 2 diabetes mellitus ([place])   . Hyperlipidemia    Past Surgical History  Procedure Laterality Date  . Breast lumpectomy      R breast   Allergies  Allergen Reactions  . Fexofenadine Hcl   . Influenza Vaccines   . Lisinopril Cough  . Statins Other (See Comments)    Pt refuses   Current Outpatient Prescriptions  Medication Sig Dispense Refill  . amLODipine (NORVASC) 10 MG tablet Take 1 tablet (10 mg total) by mouth daily. 90 tablet 3  . metFORMIN (GLUCOPHAGE-XR) 500 MG 24 hr tablet Take 2 tablets (1,000 mg total) by mouth daily with breakfast. 180 tablet 3  . blood glucose meter kit and supplies KIT Dispense based on patient and insurance preference. Check sugars once daily. (FOR ICD-9 250.00, 250.01). 1 each 0  . glucose blood test strip Test blood sugar once daily. Dx: E11.9 100 each 3  . Lancets MISC Test blood sugar once daily. Dx: E11.9 100 each 3   No current facility-administered medications for this visit.   Social History  Social History  . Marital Status: Married    Spouse Name: N/A  . Number of  Children: 1  . Years of Education: N/A   Occupational History  . housecleaning     once weekly   Social History Main Topics  . Smoking status: Never Smoker   . Smokeless tobacco: Never Used  . Alcohol Use: 1.2 oz/week    2 Standard drinks or equivalent per week     Comment: Socially - 1x/mo  . Drug Use: No  . Sexual Activity: Yes    Birth Control/ Protection: Post-menopausal   Other Topics Concern  . Not on file   Social History Narrative   Marital status: married x 7 years      Children:  1 son; 2 grandchildren; no gg      Lives: with husband      Employment: cleans houses; works Thursday      Tobacco;  None      Alcohol: holidays       Exercise:  None       Seatbelt:  100%; texting none         Family History  Problem Relation Age of Onset  . Kidney failure Mother   . Diabetes Mother   . Alcohol abuse Father   . Liver disease Father   . Lupus Sister   . Mental retardation Sister     Bipolar disorder       Objective:    BP 119/80 mmHg  Pulse 79  Temp(Src) 98.1 F (36.7 C) (Oral)  Resp 16  Ht 5' 6.75" (1.695 m)  Wt 188 lb (85.276 kg)  BMI 29.68 kg/m2  SpO2 100% Physical Exam  Constitutional: She is oriented to person, place, and time. She appears well-developed and well-nourished. No distress.  HENT:  Head: Normocephalic and atraumatic.  Right Ear: External ear normal.  Left Ear: External ear normal.  Nose: Nose normal.  Mouth/Throat: Oropharynx is clear and moist.  Eyes: Conjunctivae and EOM are normal. Pupils are equal, round, and reactive to light.  Neck: Normal range of motion and full passive range of motion without pain. Neck supple. No JVD present. Carotid bruit is not present. No thyromegaly present.  Cardiovascular: Normal rate, regular rhythm and normal heart sounds.  Exam reveals no gallop and no friction rub.   No murmur heard. Pulmonary/Chest: Effort normal and breath sounds normal. She has no wheezes. She has no rales.  Abdominal:  Soft. Bowel sounds are normal. She exhibits no distension and no mass. There is no tenderness. There is no rebound and no guarding.  Musculoskeletal:       Right shoulder: Normal.       Left shoulder: Normal.       Cervical back: Normal.  Lymphadenopathy:    She has no cervical adenopathy.  Neurological: She is alert and oriented to person, place, and time. She has normal reflexes. No cranial nerve deficit. She exhibits normal muscle tone. Coordination normal.  Skin: Skin is warm and dry. No rash noted. She is not diaphoretic. No erythema. No pallor.  Psychiatric: She has a normal mood and affect. Her behavior is normal. Judgment and thought content normal.  Nursing note and vitals reviewed.  Results for orders placed or performed in visit on 07/03/15  HM DIABETES EYE EXAM  Result Value Ref Range   HM Diabetic Eye Exam No Retinopathy No Retinopathy       Assessment & Plan:   1. Routine physical examination  2. Gastroesophageal reflux disease without esophagitis   3. Anxiety   4. Essential hypertension   5. Type 2 diabetes mellitus with diabetic polyneuropathy, without long-term current use of insulin ([place])   6. Hyperlipidemia     Orders Placed This Encounter  Procedures  . CBC with Differential/Platelet  . Comprehensive metabolic panel    Order Specific Question:  Has the patient fasted?    Answer:  Yes  . Hemoglobin A1c  . Lipid panel    Order Specific Question:  Has the patient fasted?    Answer:  Yes  . TSH  . POCT urinalysis dipstick  . EKG 12-Lead   No orders of the defined types were placed in this encounter.    No Follow-up on file.    Kimbley Sprague Elayne Guerin, M.D. [facility] [address] [place], [place]  [address] [phone] phone [phone] fax

## 2015-10-08 ENCOUNTER — Other Ambulatory Visit: Payer: Self-pay | Admitting: Family Medicine

## 2015-10-08 DIAGNOSIS — R102 Pelvic and perineal pain: Secondary | ICD-10-CM

## 2015-10-16 ENCOUNTER — Ambulatory Visit (HOSPITAL_COMMUNITY)
Admission: RE | Admit: 2015-10-16 | Discharge: 2015-10-16 | Disposition: A | Discharge status: Home / Self Care | Payer: BLUE CROSS/BLUE SHIELD | Source: Ambulatory Visit | Attending: Family Medicine | Admitting: Family Medicine

## 2015-10-16 DIAGNOSIS — R102 Pelvic and perineal pain: Secondary | ICD-10-CM

## 2015-10-16 DIAGNOSIS — D259 Leiomyoma of uterus, unspecified: Secondary | ICD-10-CM | POA: Insufficient documentation

## 2015-10-16 DIAGNOSIS — N854 Malposition of uterus: Secondary | ICD-10-CM | POA: Insufficient documentation

## 2015-10-17 MED ORDER — METFORMIN HCL ER 500 MG PO TB24: 1500.0000 mg | ORAL_TABLET | Freq: Every day | ORAL | Status: DC

## 2015-10-17 NOTE — Addendum Note (Signed)
Addended by: Wardell Honour on: 10/17/2015 11:57 AM   Modules accepted: Orders

## 2015-10-18 ENCOUNTER — Telehealth: Payer: Self-pay

## 2015-10-18 NOTE — Telephone Encounter (Signed)
[facility] Patients call back number is [phone]

## 2015-10-22 NOTE — Telephone Encounter (Signed)
Left VM informing pt we cannot give out results from another facility. I don't see an encounter where she was at [facility] or any lab work done there.

## 2015-10-22 NOTE — Telephone Encounter (Signed)
This is correct, Alexis Shields, she needs to contact her provider from the [facility] for results review.

## 2016-01-28 ENCOUNTER — Other Ambulatory Visit: Payer: Self-pay

## 2016-01-28 ENCOUNTER — Ambulatory Visit (INDEPENDENT_AMBULATORY_CARE_PROVIDER_SITE_OTHER): Admitting: Family Medicine

## 2016-01-28 ENCOUNTER — Encounter: Payer: Self-pay | Admitting: Family Medicine

## 2016-01-28 VITALS — BP 130/84 | HR 96 | Temp 98.1°F | Resp 16 | Ht 66.5 in | Wt 190.4 lb

## 2016-01-28 DIAGNOSIS — I10 Essential (primary) hypertension: Secondary | ICD-10-CM | POA: Diagnosis not present

## 2016-01-28 DIAGNOSIS — E1142 Type 2 diabetes mellitus with diabetic polyneuropathy: Secondary | ICD-10-CM | POA: Diagnosis not present

## 2016-01-28 DIAGNOSIS — E785 Hyperlipidemia, unspecified: Secondary | ICD-10-CM | POA: Diagnosis not present

## 2016-01-28 DIAGNOSIS — K219 Gastro-esophageal reflux disease without esophagitis: Secondary | ICD-10-CM

## 2016-01-28 LAB — LIPID PANEL
CHOL/HDL RATIO: 4 ratio (ref <=5.0)
CHOLESTEROL: 221 mg/dL — AB (ref 125–200)
HDL: 55 mg/dL (ref >=46)
LDL CALC: 142 mg/dL — AB (ref <130)
TRIGLYCERIDES: 122 mg/dL (ref <150)
VLDL: 24 mg/dL (ref <30)

## 2016-01-28 LAB — CBC WITH DIFFERENTIAL/PLATELET
Basophils Absolute: 0 cells/uL (ref 0–200)
Basophils Relative: 0 %
EOS ABS: 132 {cells}/uL (ref 15–500)
EOS PCT: 2 %
HEMATOCRIT: 40.7 % (ref 35.0–45.0)
HEMOGLOBIN: 13.8 g/dL (ref 11.7–15.5)
LYMPHS ABS: 2442 {cells}/uL (ref 850–3900)
LYMPHS PCT: 37 %
MCH: 27.4 pg (ref 27.0–33.0)
MCHC: 33.9 g/dL (ref 32.0–36.0)
MCV: 80.9 fL (ref 80.0–100.0)
MONO ABS: 462 {cells}/uL (ref 200–950)
MPV: 10.4 fL (ref 7.5–12.5)
Monocytes Relative: 7 %
NEUTROS PCT: 54 %
Neutro Abs: 3564 cells/uL (ref 1500–7800)
Platelets: 307 10*3/uL (ref 140–400)
RBC: 5.03 MIL/uL (ref 3.80–5.10)
RDW: 14.1 % (ref 11.0–15.0)
WBC: 6.6 10*3/uL (ref 3.8–10.8)

## 2016-01-28 LAB — COMPREHENSIVE METABOLIC PANEL
ALBUMIN: 4.4 g/dL (ref 3.6–5.1)
ALT: 14 U/L (ref 6–29)
AST: 17 U/L (ref 10–35)
Alkaline Phosphatase: 90 U/L (ref 33–130)
BUN: 11 mg/dL (ref 7–25)
CALCIUM: 10.1 mg/dL (ref 8.6–10.4)
CHLORIDE: 104 mmol/L (ref 98–110)
CO2: 26 mmol/L (ref 20–31)
Creat: 0.88 mg/dL (ref 0.50–1.05)
Glucose, Bld: 106 mg/dL — ABNORMAL HIGH (ref 65–99)
POTASSIUM: 4 mmol/L (ref 3.5–5.3)
SODIUM: 139 mmol/L (ref 135–146)
TOTAL PROTEIN: 7.3 g/dL (ref 6.1–8.1)
Total Bilirubin: 0.3 mg/dL (ref 0.2–1.2)

## 2016-01-28 LAB — GLUCOSE, POCT (MANUAL RESULT ENTRY): POC Glucose: 113 mg/dl — AB (ref 70–99)

## 2016-01-28 LAB — POCT GLYCOSYLATED HEMOGLOBIN (HGB A1C): Hemoglobin A1C: 7.3

## 2016-01-28 MED ORDER — GLUCOSE BLOOD VI STRP: ORAL_STRIP | Status: DC

## 2016-01-28 MED ORDER — METFORMIN HCL ER 500 MG PO TB24: 1500.0000 mg | ORAL_TABLET | Freq: Every day | ORAL | Status: DC

## 2016-01-28 MED ORDER — LANCETS MISC: Status: DC

## 2016-01-28 MED ORDER — AMLODIPINE BESYLATE 10 MG PO TABS: 10.0000 mg | ORAL_TABLET | Freq: Every day | ORAL | Status: DC

## 2016-01-28 NOTE — Progress Notes (Signed)
Subjective:    Patient ID: Alexis Shields, female    DOB: 1955/12/20, 60 y.o.   MRN: [id number]  01/28/2016  Follow-up; Diabetes; and Medication Refill   HPI This 60 y.o. female presents for four month follow-up of the following:   1. DMII: Has been working really hard since last visit.  Walking three days per week and also cutting back.  Feels great.  No bacon in two months.  Doing well.  No food diary.  Eating lots of fruit and snack bars [employer].  No Mr. Donell Sievert since Christmas. Sugars running 130s.  One morning sugar was 110.   Taking Metformin 3 times daily but had diarrhea; returned to one daily; rarely takes 2 daily.  Forgets about second tablet.   B:  Boiled eggs 1, red sausage or craft cheese or granola bar, water or coffee Snack:  None Lunch:  Sporadic time; salad or [employer] fish filet, coke or Dr. Malachi Bonds.   Snack:  Jello or grapefruit. Supper:  Microwave dinner with shrimp, chicken, pasta Ms Tamala Julian; low calories.  Does not cook when husband travels.  Or pasta with hamburger meatballs, water.    Walking for 3 months.  Walking through neighborhood causes leg pain.  Walking in a mall is not good.    2.  HTN: Patient reports good compliance with medication, good tolerance to medication, and good symptom control.    3. Hyperlipidemia: refuses statin therapy.  4. Pelvic pain:  S/p pelvic US; duration three days; occurs once per month on average.  No n/v/d.  No constipation.  No dysuria, urgency, frequency.  5.  Cough: present for a while  Review of Systems  Constitutional: Negative for fever, chills, diaphoresis and fatigue.  Eyes: Negative for visual disturbance.  Respiratory: Positive for cough. Negative for shortness of breath.   Cardiovascular: Negative for chest pain, palpitations and leg swelling.  Gastrointestinal: Negative for nausea, vomiting, abdominal pain, diarrhea and constipation.  Endocrine: Negative for cold intolerance, heat intolerance, polydipsia,  polyphagia and polyuria.  Neurological: Negative for dizziness, tremors, seizures, syncope, facial asymmetry, speech difficulty, weakness, light-headedness, numbness and headaches.    Past Medical History  Diagnosis Date  . Back pain   . Anemia   . Overweight (BMI 25.0-29.9)   . GERD (gastroesophageal reflux disease)   . Hypertension   . Type 2 diabetes mellitus ([place])   . Hyperlipidemia    Past Surgical History  Procedure Laterality Date  . Breast lumpectomy      R breast   Allergies  Allergen Reactions  . Fexofenadine Hcl   . Influenza Vaccines   . Lisinopril Cough  . Statins Other (See Comments)    Pt refuses   Current Outpatient Prescriptions  Medication Sig Dispense Refill  . amLODipine (NORVASC) 10 MG tablet Take 1 tablet (10 mg total) by mouth daily. 90 tablet 3  . blood glucose meter kit and supplies KIT Dispense based on patient and insurance preference. Check sugars once daily. (FOR ICD-9 250.00, 250.01). 1 each 0  . glucose blood test strip Test blood sugar once daily. Dx: E11.9 100 each 3  . metFORMIN (GLUCOPHAGE-XR) 500 MG 24 hr tablet Take 3 tablets (1,500 mg total) by mouth daily with breakfast. 270 tablet 3  . Blood Glucose Monitoring Suppl (FREESTYLE LITE) DEVI Test blood sugar once daily. Dx: E11.9 1 each 0  . glucose blood (FREESTYLE LITE) test strip Test blood sugar once daily. Dx: E11.9 100 each 3  . Lancets (FREESTYLE) lancets  Test blood sugar once daily. Dx: E11.9 100 each 3   No current facility-administered medications for this visit.   Social History   Social History  . Marital Status: Married    Spouse Name: N/A  . Number of Children: 1  . Years of Education: N/A   Occupational History  . housecleaning     once weekly   Social History Main Topics  . Smoking status: Never Smoker   . Smokeless tobacco: Never Used  . Alcohol Use: 1.2 oz/week    2 Standard drinks or equivalent per week     Comment: Socially - 1x/mo  . Drug Use: No  .  Sexual Activity: Yes    Birth Control/ Protection: Post-menopausal   Other Topics Concern  . Not on file   Social History Narrative   Marital status: married x 7 years      Children:  1 son; 2 grandchildren; no gg      Lives: with husband      Employment: cleans houses; works Thursday      Tobacco;  None      Alcohol: holidays       Exercise:  None       Seatbelt:  100%; texting none         Family History  Problem Relation Age of Onset  . Kidney failure Mother   . Diabetes Mother   . Alcohol abuse Father   . Liver disease Father   . Lupus Sister   . Mental retardation Sister     Bipolar disorder       Objective:    BP 130/84 mmHg  Pulse 96  Temp(Src) 98.1 F (36.7 C) (Oral)  Resp 16  Ht 5' 6.5" (1.689 m)  Wt 190 lb 6.4 oz (86.365 kg)  BMI 30.27 kg/m2  SpO2 98% Physical Exam  Constitutional: She is oriented to person, place, and time. She appears well-developed and well-nourished. No distress.  HENT:  Head: Normocephalic and atraumatic.  Right Ear: External ear normal.  Left Ear: External ear normal.  Nose: Nose normal.  Mouth/Throat: Oropharynx is clear and moist.  Eyes: Conjunctivae and EOM are normal. Pupils are equal, round, and reactive to light.  Neck: Normal range of motion. Neck supple. Carotid bruit is not present. No thyromegaly present.  Cardiovascular: Normal rate, regular rhythm, normal heart sounds and intact distal pulses.  Exam reveals no gallop and no friction rub.   No murmur heard. Pulmonary/Chest: Effort normal and breath sounds normal. She has no wheezes. She has no rales.  Abdominal: Soft. Bowel sounds are normal. She exhibits no distension and no mass. There is no tenderness. There is no rebound and no guarding.  Lymphadenopathy:    She has no cervical adenopathy.  Neurological: She is alert and oriented to person, place, and time. No cranial nerve deficit.  Skin: Skin is warm and dry. No rash noted. She is not diaphoretic. No erythema.  No pallor.  Psychiatric: She has a normal mood and affect. Her behavior is normal.        Assessment & Plan:   1. Type 2 diabetes mellitus with diabetic polyneuropathy, without long-term current use of insulin ([place])   2. Hyperlipidemia   3. Essential hypertension   4. Gastroesophageal reflux disease without esophagitis     Orders Placed This Encounter  Procedures  . CBC with Differential/Platelet  . Comprehensive metabolic panel    Order Specific Question:  Has the patient fasted?    Answer:  Yes  . Lipid panel    Order Specific Question:  Has the patient fasted?    Answer:  Yes  . POCT glucose (manual entry)  . POCT glycosylated hemoglobin (Hb A1C)  . HM Diabetes Foot Exam   Meds ordered this encounter  Medications  . metFORMIN (GLUCOPHAGE-XR) 500 MG 24 hr tablet    Sig: Take 3 tablets (1,500 mg total) by mouth daily with breakfast.    Dispense:  270 tablet    Refill:  3  . amLODipine (NORVASC) 10 MG tablet    Sig: Take 1 tablet (10 mg total) by mouth daily.    Dispense:  90 tablet    Refill:  3  . DISCONTD: Lancets MISC    Sig: Test blood sugar once daily. Dx: E11.9    Dispense:  100 each    Refill:  3    ONE TOUCH DELICA 86W LANCETS  . glucose blood test strip    Sig: Test blood sugar once daily. Dx: E11.9    Dispense:  100 each    Refill:  3    ONE TOUCH ULTRA STRIPS    Return in about 6 months (around 07/30/2016) for recheck diabetes, high blood pressure, high cholesterol.    Hajime Asfaw Elayne Guerin, M.D. [facility] [address] [place], [place]  [address] [phone] phone [phone] fax

## 2016-01-28 NOTE — Telephone Encounter (Signed)
Pt came into 102 to get RFs of amlodipine and metformin sent to [facility]. She reported to [facility] that local pharm gave her #[phone] to cover her until mail order can arrive. Advised through [place] that she is due for DM f/up and she is scheduling appt. Can we approve RFs until then?  Pt actually was fit into your sch today, I will leave these pended.

## 2016-01-28 NOTE — Patient Instructions (Addendum)
[email or web address]    IF you received an x-ray today, you will receive an invoice from [facility]. Please contact [facility] at [phone] with questions or concerns regarding your invoice.   IF you received labwork today, you will receive an invoice from [employer]. Please contact [facility] at [phone] with questions or concerns regarding your invoice.   Our billing staff will not be able to assist you with questions regarding bills from these companies.  You will be contacted with the lab results as soon as they are available. The fastest way to get your results is to activate your My Chart account. Instructions are located on the last page of this paperwork. If you have not heard from [place] regarding the results in 2 weeks, please contact this office.    Diabetes and Foot Care Diabetes may cause you to have problems because of poor blood supply (circulation) to your feet and legs. This may cause the skin on your feet to become thinner, break easier, and heal more slowly. Your skin may become dry, and the skin may peel and crack. You may also have nerve damage in your legs and feet causing decreased feeling in them. You may not notice minor injuries to your feet that could lead to infections or more serious problems. Taking care of your feet is one of the most important things you can do for yourself.  HOME CARE INSTRUCTIONS  Wear shoes at all times, even in the house. Do not go barefoot. Bare feet are easily injured.  Check your feet daily for blisters, cuts, and redness. If you cannot see the bottom of your feet, use a mirror or ask someone for help.  Wash your feet with warm water (do not use hot water) and mild soap. Then pat your feet and the areas between your toes until they are completely dry. Do not soak your feet as this can dry your skin.  Apply a moisturizing lotion or petroleum jelly (that does not contain alcohol and is unscented)  to the skin on your feet and to dry, brittle toenails. Do not apply lotion between your toes.  Trim your toenails straight across. Do not dig under them or around the cuticle. File the edges of your nails with an emery board or nail file.  Do not cut corns or calluses or try to remove them with medicine.  Wear clean socks or stockings every day. Make sure they are not too tight. Do not wear knee-high stockings since they may decrease blood flow to your legs.  Wear shoes that fit properly and have enough cushioning. To break in new shoes, wear them for just a few hours a day. This prevents you from injuring your feet. Always look in your shoes before you put them on to be sure there are no objects inside.  Do not cross your legs. This may decrease the blood flow to your feet.  If you find a minor scrape, cut, or break in the skin on your feet, keep it and the skin around it clean and dry. These areas may be cleansed with mild soap and water. Do not cleanse the area with peroxide, alcohol, or iodine.  When you remove an adhesive bandage, be sure not to damage the skin around it.  If you have a wound, look at it several times a day to make sure it is healing.  Do not use heating pads or hot water bottles. They may burn your skin. If you have  lost feeling in your feet or legs, you may not know it is happening until it is too late.  Make sure your health care provider performs a complete foot exam at least annually or more often if you have foot problems. Report any cuts, sores, or bruises to your health care provider immediately. SEEK MEDICAL CARE IF:   You have an injury that is not healing.  You have cuts or breaks in the skin.  You have an ingrown nail.  You notice redness on your legs or feet.  You feel burning or tingling in your legs or feet.  You have pain or cramps in your legs and feet.  Your legs or feet are numb.  Your feet always feel cold. SEEK IMMEDIATE MEDICAL CARE  IF:   There is increasing redness, swelling, or pain in or around a wound.  There is a red line that goes up your leg.  Pus is coming from a wound.  You develop a fever or as directed by your health care provider.  You notice a bad smell coming from an ulcer or wound.   This information is not intended to replace advice given to you by your health care provider. Make sure you discuss any questions you have with your health care provider.   Document Released: 08/21/2000 Document Revised: 04/26/2013 Document Reviewed: 01/31/2013 Elsevier Interactive Patient Education [employer].

## 2016-01-29 ENCOUNTER — Telehealth: Payer: Self-pay

## 2016-01-29 DIAGNOSIS — E1142 Type 2 diabetes mellitus with diabetic polyneuropathy: Secondary | ICD-10-CM

## 2016-01-29 MED ORDER — FREESTYLE LITE DEVI: Status: DC

## 2016-01-29 MED ORDER — GLUCOSE BLOOD VI STRP: ORAL_STRIP | Status: DC

## 2016-01-29 MED ORDER — FREESTYLE LANCETS MISC: Status: DC

## 2016-01-29 NOTE — Telephone Encounter (Signed)
Ins faxed notice that One Touch Ultra strips are not covered by ins w/out PA which will not be approved unless pt can not use the preferred for some reason. I will send in new Rxs for meter/strips and lancets from preferred FreeStyle Lite brand.

## 2016-02-06 ENCOUNTER — Other Ambulatory Visit: Payer: Self-pay | Admitting: *Deleted

## 2016-02-06 MED ORDER — FREESTYLE LITE DEVI: Status: DC

## 2016-06-04 LAB — HM DIABETES EYE EXAM

## 2016-07-28 ENCOUNTER — Ambulatory Visit (INDEPENDENT_AMBULATORY_CARE_PROVIDER_SITE_OTHER): Admitting: Family Medicine

## 2016-07-28 ENCOUNTER — Encounter: Payer: Self-pay | Admitting: Family Medicine

## 2016-07-28 VITALS — BP 120/90 | HR 91 | Temp 98.6°F | Resp 16 | Ht 67.0 in | Wt 192.6 lb

## 2016-07-28 DIAGNOSIS — Z6830 Body mass index (BMI) 30.0-30.9, adult: Secondary | ICD-10-CM | POA: Diagnosis not present

## 2016-07-28 DIAGNOSIS — E7800 Pure hypercholesterolemia, unspecified: Secondary | ICD-10-CM

## 2016-07-28 DIAGNOSIS — E1142 Type 2 diabetes mellitus with diabetic polyneuropathy: Secondary | ICD-10-CM | POA: Diagnosis not present

## 2016-07-28 DIAGNOSIS — E6609 Other obesity due to excess calories: Secondary | ICD-10-CM

## 2016-07-28 DIAGNOSIS — E66811 Other obesity due to excess calories: Secondary | ICD-10-CM

## 2016-07-28 DIAGNOSIS — I10 Essential (primary) hypertension: Secondary | ICD-10-CM

## 2016-07-28 LAB — COMPREHENSIVE METABOLIC PANEL
ALK PHOS: 69 U/L (ref 33–130)
ALT: 16 U/L (ref 6–29)
AST: 17 U/L (ref 10–35)
Albumin: 4.3 g/dL (ref 3.6–5.1)
BILIRUBIN TOTAL: 0.4 mg/dL (ref 0.2–1.2)
BUN: 9 mg/dL (ref 7–25)
CALCIUM: 10 mg/dL (ref 8.6–10.4)
CO2: 28 mmol/L (ref 20–31)
Chloride: 103 mmol/L (ref 98–110)
Creat: 0.85 mg/dL (ref 0.50–0.99)
GLUCOSE: 167 mg/dL — AB (ref 65–99)
Potassium: 4.6 mmol/L (ref 3.5–5.3)
Sodium: 141 mmol/L (ref 135–146)
TOTAL PROTEIN: 7.1 g/dL (ref 6.1–8.1)

## 2016-07-28 LAB — CBC WITH DIFFERENTIAL/PLATELET
Basophils Absolute: 0 cells/uL (ref 0–200)
Basophils Relative: 0 %
Eosinophils Absolute: 102 cells/uL (ref 15–500)
Eosinophils Relative: 2 %
HEMATOCRIT: 41.9 % (ref 35.0–45.0)
Hemoglobin: 13.8 g/dL (ref 11.7–15.5)
LYMPHS PCT: 41 %
Lymphs Abs: 2091 cells/uL (ref 850–3900)
MCH: 26.3 pg — ABNORMAL LOW (ref 27.0–33.0)
MCHC: 32.9 g/dL (ref 32.0–36.0)
MCV: 79.8 fL — ABNORMAL LOW (ref 80.0–100.0)
MONO ABS: 306 {cells}/uL (ref 200–950)
MPV: 9.8 fL (ref 7.5–12.5)
Monocytes Relative: 6 %
NEUTROS ABS: 2601 {cells}/uL (ref 1500–7800)
Neutrophils Relative %: 51 %
PLATELETS: 288 10*3/uL (ref 140–400)
RBC: 5.25 MIL/uL — AB (ref 3.80–5.10)
RDW: 14.2 % (ref 11.0–15.0)
WBC: 5.1 10*3/uL (ref 3.8–10.8)

## 2016-07-28 LAB — POCT URINALYSIS DIP (MANUAL ENTRY)
Bilirubin, UA: NEGATIVE
GLUCOSE UA: NEGATIVE
Ketones, POC UA: NEGATIVE
NITRITE UA: NEGATIVE
Protein Ur, POC: NEGATIVE
RBC UA: NEGATIVE
Spec Grav, UA: 1.015
UROBILINOGEN UA: 0.2
pH, UA: 7

## 2016-07-28 LAB — LIPID PANEL
Cholesterol: 224 mg/dL — ABNORMAL HIGH (ref <200)
HDL: 54 mg/dL (ref >50)
LDL CALC: 157 mg/dL — AB (ref <100)
Total CHOL/HDL Ratio: 4.1 Ratio (ref <5.0)
Triglycerides: 64 mg/dL (ref <150)
VLDL: 13 mg/dL (ref <30)

## 2016-07-28 LAB — HEMOGLOBIN A1C
Hgb A1c MFr Bld: 7.9 % — ABNORMAL HIGH (ref <5.7)
Mean Plasma Glucose: 180 mg/dL

## 2016-07-28 LAB — MICROALBUMIN, URINE: MICROALB UR: 0.5 mg/dL

## 2016-07-28 NOTE — Progress Notes (Signed)
 Subjective:    Patient ID: Alexis Shields, female    DOB: 04/08/1956, 60 y.o.   MRN: [id number]  07/28/2016  Follow-up (6 mos, diabetes)   HPI This 60 y.o. female presents for six month follow-up of DMII, hypertension, hypercholesterolemia.  Home BP runnings 120s/80-90s.  Sugars are running not sure: "I don't remember".  Sugars have been good; has been doing vinegar.  Taking Metformin XR 500mg one daily; cannot tolerate more Metformin due to GI distress.  Sometimes will take two in the mornings; Metformin makes patient drowsy.  Not exercising due to cold weather.  During summer months, was walking 2 miles.  Keeping food diary; was not eating enough.  Pt refuses cholesterol medication.  Using garlic to food. S/p eye exam by Peter Dunn in July.  BP Readings from Last 3 Encounters:  07/28/16 120/90  01/28/16 130/84  10/04/15 119/80   Wt Readings from Last 3 Encounters:  07/28/16 192 lb 9.6 oz (87.4 kg)  01/28/16 190 lb 6.4 oz (86.4 kg)  10/04/15 188 lb (85.3 kg)     Review of Systems  Constitutional: Negative for chills, diaphoresis, fatigue and fever.  Eyes: Negative for visual disturbance.  Respiratory: Negative for cough and shortness of breath.   Cardiovascular: Negative for chest pain, palpitations and leg swelling.  Gastrointestinal: Negative for abdominal pain, constipation, diarrhea, nausea and vomiting.  Endocrine: Negative for cold intolerance, heat intolerance, polydipsia, polyphagia and polyuria.  Neurological: Negative for dizziness, tremors, seizures, syncope, facial asymmetry, speech difficulty, weakness, light-headedness, numbness and headaches.    Past Medical History:  Diagnosis Date  . Anemia   . Back pain   . GERD (gastroesophageal reflux disease)   . Hyperlipidemia   . Hypertension   . Overweight (BMI 25.0-29.9)   . Type 2 diabetes mellitus (HCC)    Past Surgical History:  Procedure Laterality Date  . BREAST LUMPECTOMY     R breast   Allergies    Allergen Reactions  . Fexofenadine Hcl   . Influenza Vaccines   . Lisinopril Cough  . Statins Other (See Comments)    Pt refuses   Current Outpatient Prescriptions  Medication Sig Dispense Refill  . amLODipine (NORVASC) 10 MG tablet Take 1 tablet (10 mg total) by mouth daily. 90 tablet 3  . aspirin EC 81 MG tablet Take 81 mg by mouth daily.    . blood glucose meter kit and supplies KIT Dispense based on patient and insurance preference. Check sugars once daily. (FOR ICD-9 250.00, 250.01). 1 each 0  . Blood Glucose Monitoring Suppl (FREESTYLE LITE) DEVI Test blood sugar once daily. Dx: E11.9 1 each 0  . glucose blood (FREESTYLE LITE) test strip Test blood sugar once daily. Dx: E11.9 100 each 3  . glucose blood test strip Test blood sugar once daily. Dx: E11.9 100 each 3  . Lancets (FREESTYLE) lancets Test blood sugar once daily. Dx: E11.9 100 each 3  . metFORMIN (GLUCOPHAGE-XR) 500 MG 24 hr tablet Take 3 tablets (1,500 mg total) by mouth daily with breakfast. 270 tablet 3   No current facility-administered medications for this visit.    Social History   Social History  . Marital status: Married    Spouse name: N/A  . Number of children: 1  . Years of education: N/A   Occupational History  . housecleaning     once weekly   Social History Main Topics  . Smoking status: Never Smoker  . Smokeless tobacco: Never Used  . Alcohol   use 1.2 oz/week    2 Standard drinks or equivalent per week     Comment: Socially - 1x/mo  . Drug use: No  . Sexual activity: Yes    Birth control/ protection: Post-menopausal   Other Topics Concern  . Not on file   Social History Narrative   Marital status: married x 7 years      Children:  1 son; 2 grandchildren; no gg      Lives: with husband      Employment: cleans houses; works Thursday      Tobacco;  None      Alcohol: holidays       Exercise:  None       Seatbelt:  100%; texting none         Family History  Problem Relation Age of  Onset  . Kidney failure Mother   . Diabetes Mother   . Alcohol abuse Father   . Liver disease Father   . Lupus Sister   . Mental retardation Sister     Bipolar disorder       Objective:    BP 120/90 (BP Location: Left Arm, Cuff Size: Normal)   Pulse 91   Temp 98.6 F (37 C) (Oral)   Resp 16   Ht 5' 7" (1.702 m)   Wt 192 lb 9.6 oz (87.4 kg)   SpO2 97%   BMI 30.17 kg/m  Physical Exam  Constitutional: She is oriented to person, place, and time. She appears well-developed and well-nourished. No distress.  HENT:  Head: Normocephalic and atraumatic.  Right Ear: External ear normal.  Left Ear: External ear normal.  Nose: Nose normal.  Mouth/Throat: Oropharynx is clear and moist.  Eyes: Conjunctivae and EOM are normal. Pupils are equal, round, and reactive to light.  Neck: Normal range of motion. Neck supple. Carotid bruit is not present. No thyromegaly present.  Cardiovascular: Normal rate, regular rhythm, normal heart sounds and intact distal pulses.  Exam reveals no gallop and no friction rub.   No murmur heard. Pulmonary/Chest: Effort normal and breath sounds normal. She has no wheezes. She has no rales.  Abdominal: Soft. Bowel sounds are normal. She exhibits no distension and no mass. There is no tenderness. There is no rebound and no guarding.  Lymphadenopathy:    She has no cervical adenopathy.  Neurological: She is alert and oriented to person, place, and time. No cranial nerve deficit.  Skin: Skin is warm and dry. No rash noted. She is not diaphoretic. No erythema. No pallor.  Psychiatric: She has a normal mood and affect. Her behavior is normal.   Results for orders placed or performed in visit on 07/28/16  POCT urinalysis dipstick  Result Value Ref Range   Color, UA yellow yellow   Clarity, UA cloudy (A) clear   Glucose, UA negative negative   Bilirubin, UA negative negative   Ketones, POC UA negative negative   Spec Grav, UA 1.015    Blood, UA negative negative    pH, UA 7.0    Protein Ur, POC negative negative   Urobilinogen, UA 0.2    Nitrite, UA Negative Negative   Leukocytes, UA large (3+) (A) Negative   Depression screen PHQ 2/9 07/28/2016 01/28/2016 10/04/2015 02/28/2015 12/19/2014  Decreased Interest 0 0 0 0 0  Down, Depressed, Hopeless 0 0 0 0 0  PHQ - 2 Score 0 0 0 0 0       Assessment & Plan:   1. Type 2   diabetes mellitus with diabetic polyneuropathy, without long-term current use of insulin (HCC)   2. Essential hypertension   3. Pure hypercholesterolemia   4. Class 1 obesity due to excess calories with serious comorbidity and body mass index (BMI) of 30.0 to 30.9 in adult    -borderline control of DMII, hypertension, and hypercholesterolemia. -pt refuses to add second antihypertensive agent, refuses statin therapy, refuses additional diabetic agent. -recommend aggressive focus on weight loss, exercise, and low-fat low-calorie food intake; recommend [email or web address].[email or web address]. Also recommend joining a gym due to weather interfering with exercise.   Orders Placed This Encounter  Procedures  . CBC with Differential/Platelet  . Comprehensive metabolic panel    Order Specific Question:   Has the patient fasted?    Answer:   Yes  . Hemoglobin A1c  . Lipid panel    Order Specific Question:   Has the patient fasted?    Answer:   Yes  . Microalbumin, urine  . POCT urinalysis dipstick   Meds ordered this encounter  Medications  . aspirin EC 81 MG tablet    Sig: Take 81 mg by mouth daily.    Return in about 6 months (around 01/25/2017) for complete physical examiniation.   Kristi Martin Smith, M.D. [facility]  [place] [address] [place], [place]  [address] [phone] phone [phone] fax  

## 2016-07-28 NOTE — Patient Instructions (Addendum)
We recommend all diabetics take a daily aspirin 81mg .    IF you received an x-ray today, you will receive an invoice from [facility]. Please contact [facility] at [phone] with questions or concerns regarding your invoice.   IF you received labwork today, you will receive an invoice from [employer]. Please contact [facility] at [phone] with questions or concerns regarding your invoice.   Our billing staff will not be able to assist you with questions regarding bills from these companies.  You will be contacted with the lab results as soon as they are available. The fastest way to get your results is to activate your My Chart account. Instructions are located on the last page of this paperwork. If you have not heard from [place] regarding the results in 2 weeks, please contact this office.     Fat and Cholesterol Restricted Diet Introduction Getting too much fat and cholesterol in your diet may cause health problems. Following this diet helps keep your fat and cholesterol at normal levels. This can keep you from getting sick. What types of fat should I choose?  Choose monosaturated and polyunsaturated fats. These are found in foods such as olive oil, canola oil, flaxseeds, walnuts, almonds, and seeds.  Eat more omega-3 fats. Good choices include salmon, mackerel, sardines, tuna, flaxseed oil, and ground flaxseeds.  Limit saturated fats. These are in animal products such as meats, butter, and cream. They can also be in plant products such as palm oil, palm kernel oil, and coconut oil.  Avoid foods with partially hydrogenated oils in them. These contain trans fats. Examples of foods that have trans fats are stick margarine, some tub margarines, cookies, crackers, and other baked goods. What general guidelines do I need to follow?  Check food labels. Look for the words "trans fat" and "saturated fat."  When preparing a meal:  Fill half of  your plate with vegetables and green salads.  Fill one fourth of your plate with whole grains. Look for the word "whole" as the first word in the ingredient list.  Fill one fourth of your plate with lean protein foods.  Eat more foods that have fiber, like apples, carrots, beans, peas, and barley.  Eat more home-cooked foods. Eat less at restaurants and buffets.  Limit or avoid alcohol.  Limit foods high in starch and sugar.  Limit fried foods.  Cook foods without frying them. Baking, boiling, grilling, and broiling are all great options.  Lose weight if you are overweight. Losing even a small amount of weight can help your overall health. It can also help prevent diseases such as diabetes and heart disease. What foods can I eat? Grains  Whole grains, such as whole wheat or whole grain breads, crackers, cereals, and pasta. Unsweetened oatmeal, bulgur, barley, quinoa, or brown rice. Corn or whole wheat flour tortillas. Vegetables  Fresh or frozen vegetables (raw, steamed, roasted, or grilled). Green salads. Fruits  All fresh, canned (in natural juice), or frozen fruits. Meat and Other Protein Products  Ground beef (85% or leaner), grass-fed beef, or beef trimmed of fat. Skinless chicken or [place]. Ground chicken or [place]. Pork trimmed of fat. All fish and seafood. Eggs. Dried beans, peas, or lentils. Unsalted nuts or seeds. Unsalted canned or dry beans. Dairy  Low-fat dairy products, such as skim or 1% milk, 2% or reduced-fat cheeses, low-fat ricotta or cottage cheese, or plain low-fat yogurt. Fats and Oils  Tub margarines without trans fats. Light or reduced-fat mayonnaise  and salad dressings. Avocado. Olive, canola, sesame, or safflower oils. Natural peanut or almond butter (choose ones without added sugar and oil). The items listed above may not be a complete list of recommended foods or beverages. Contact your dietitian for more options.  What foods are not recommended? Grains   White bread. White pasta. White rice. Cornbread. Bagels, pastries, and croissants. Crackers that contain trans fat. Vegetables  White potatoes. Corn. Creamed or fried vegetables. Vegetables in a cheese sauce. Fruits  Dried fruits. Canned fruit in light or heavy syrup. Fruit juice. Meat and Other Protein Products  Fatty cuts of meat. Ribs, chicken wings, bacon, sausage, bologna, salami, chitterlings, fatback, hot dogs, bratwurst, and packaged luncheon meats. Liver and organ meats. Dairy  Whole or 2% milk, cream, half-and-half, and cream cheese. Whole milk cheeses. Whole-fat or sweetened yogurt. Full-fat cheeses. Nondairy creamers and whipped toppings. Processed cheese, cheese spreads, or cheese curds. Sweets and Desserts  Corn syrup, sugars, honey, and molasses. Candy. Jam and jelly. Syrup. Sweetened cereals. Cookies, pies, cakes, donuts, muffins, and ice cream. Fats and Oils  Butter, stick margarine, lard, shortening, ghee, or bacon fat. Coconut, palm kernel, or palm oils. Beverages  Alcohol. Sweetened drinks (such as sodas, lemonade, and fruit drinks or punches). The items listed above may not be a complete list of foods and beverages to avoid. Contact your dietitian for more information.  This information is not intended to replace advice given to you by your health care provider. Make sure you discuss any questions you have with your health care provider. Document Released: 02/23/2012 Document Revised: 04/30/2016 Document Reviewed: 11/23/2013  2017 Elsevier

## 2017-01-26 ENCOUNTER — Telehealth: Payer: Self-pay | Admitting: Family Medicine

## 2017-01-26 ENCOUNTER — Encounter: Payer: Self-pay | Admitting: Family Medicine

## 2017-01-26 ENCOUNTER — Ambulatory Visit (INDEPENDENT_AMBULATORY_CARE_PROVIDER_SITE_OTHER): Admitting: Family Medicine

## 2017-01-26 ENCOUNTER — Other Ambulatory Visit: Payer: Self-pay | Admitting: Family Medicine

## 2017-01-26 VITALS — BP 125/86 | HR 93 | Temp 98.9°F | Resp 16 | Ht 66.5 in | Wt 189.8 lb

## 2017-01-26 DIAGNOSIS — I10 Essential (primary) hypertension: Secondary | ICD-10-CM

## 2017-01-26 DIAGNOSIS — M545 Low back pain, unspecified: Secondary | ICD-10-CM

## 2017-01-26 DIAGNOSIS — E7800 Pure hypercholesterolemia, unspecified: Secondary | ICD-10-CM

## 2017-01-26 DIAGNOSIS — R0789 Other chest pain: Secondary | ICD-10-CM | POA: Diagnosis not present

## 2017-01-26 DIAGNOSIS — Z1239 Encounter for other screening for malignant neoplasm of breast: Secondary | ICD-10-CM

## 2017-01-26 DIAGNOSIS — Z23 Encounter for immunization: Secondary | ICD-10-CM | POA: Diagnosis not present

## 2017-01-26 DIAGNOSIS — Z124 Encounter for screening for malignant neoplasm of cervix: Secondary | ICD-10-CM | POA: Diagnosis not present

## 2017-01-26 DIAGNOSIS — Z0000 Encounter for general adult medical examination without abnormal findings: Secondary | ICD-10-CM

## 2017-01-26 DIAGNOSIS — E1142 Type 2 diabetes mellitus with diabetic polyneuropathy: Secondary | ICD-10-CM

## 2017-01-26 DIAGNOSIS — K219 Gastro-esophageal reflux disease without esophagitis: Secondary | ICD-10-CM

## 2017-01-26 DIAGNOSIS — Z1231 Encounter for screening mammogram for malignant neoplasm of breast: Secondary | ICD-10-CM | POA: Diagnosis not present

## 2017-01-26 LAB — POCT URINALYSIS DIP (MANUAL ENTRY)
BILIRUBIN UA: NEGATIVE
Blood, UA: NEGATIVE
Nitrite, UA: NEGATIVE
Protein Ur, POC: NEGATIVE mg/dL
Spec Grav, UA: >=1.03 — AB (ref 1.010–1.025)
UROBILINOGEN UA: 0.2 U/dL
pH, UA: 6 (ref 5.0–8.0)

## 2017-01-26 MED ORDER — ZOSTER VAC RECOMB ADJUVANTED 50 MCG/0.5ML IM SUSR: 0.5000 mL | Freq: Once | INTRAMUSCULAR | 1 refills | Status: DC

## 2017-01-26 MED ORDER — AMLODIPINE BESYLATE 10 MG PO TABS: 10.0000 mg | ORAL_TABLET | Freq: Every day | ORAL | 3 refills | Status: DC

## 2017-01-26 MED ORDER — METFORMIN HCL ER 500 MG PO TB24: 1500.0000 mg | ORAL_TABLET | Freq: Every day | ORAL | 3 refills | Status: DC

## 2017-01-26 MED ORDER — VENLAFAXINE HCL ER 37.5 MG PO CP24: 37.5000 mg | ORAL_CAPSULE | Freq: Every day | ORAL | 1 refills | Status: DC

## 2017-01-26 MED ORDER — GLUCOSE BLOOD VI STRP: ORAL_STRIP | 3 refills | Status: DC

## 2017-01-26 MED ORDER — MELOXICAM 15 MG PO TABS: 15.0000 mg | ORAL_TABLET | Freq: Every day | ORAL | 0 refills | Status: DC

## 2017-01-26 MED ORDER — ZOSTER VAC RECOMB ADJUVANTED 50 MCG/0.5ML IM SUSR: 0.5000 mL | Freq: Once | INTRAMUSCULAR | 1 refills | Status: AC

## 2017-01-26 MED ORDER — FREESTYLE LANCETS MISC: 3 refills | Status: DC

## 2017-01-26 MED ORDER — METHOCARBAMOL 500 MG PO TABS: 500.0000 mg | ORAL_TABLET | Freq: Three times a day (TID) | ORAL | 0 refills | Status: DC | PRN

## 2017-01-26 NOTE — Patient Instructions (Addendum)
We recommend that you schedule a mammogram for breast cancer screening. Typically, you do not need a referral to do this. Please contact a local imaging center to schedule your mammogram.  [facility] - [phone]  *ask for the Radiology Department [facility]) - [phone] or [phone]  [facility] - [phone] [facility] - [phone] [facility] - [phone]  *ask for the Radiology Department [facility] - [phone]  *ask for the Radiology Department [facility] - [phone]  *ask for the Mammography Department [facility] - [phone]    IF you received an x-ray today, you will receive an invoice from [facility]. Please contact [facility] at [phone] with questions or concerns regarding your invoice.   IF you received labwork today, you will receive an invoice from [facility]. Please contact [facility] at [phone] with questions or concerns regarding your invoice.   Our billing staff will not be able to assist you with questions regarding bills from these companies.  You will be contacted with the lab results as soon as they are available. The fastest way to get your results is to activate your My Chart account. Instructions are located on the last page of this paperwork. If you have not heard from us regarding the results in 2 weeks, please contact this office.      Preventive Care 40-64 Years, Female Preventive care refers to lifestyle choices and visits with your health care provider that can promote health and wellness. What does preventive care include?  A yearly physical exam. This is also called an annual well check.  Dental exams once or twice a year.  Routine eye exams. Ask your health care provider how often you should have your eyes checked.  Personal lifestyle choices, including: ? Daily care of your teeth and  gums. ? Regular physical activity. ? Eating a healthy diet. ? Avoiding tobacco and drug use. ? Limiting alcohol use. ? Practicing safe sex. ? Taking low-dose aspirin daily starting at age 50. ? Taking vitamin and mineral supplements as recommended by your health care provider. What happens during an annual well check? The services and screenings done by your health care provider during your annual well check will depend on your age, overall health, lifestyle risk factors, and family history of disease. Counseling Your health care provider may ask you questions about your:  Alcohol use.  Tobacco use.  Drug use.  Emotional well-being.  Home and relationship well-being.  Sexual activity.  Eating habits.  Work and work environment.  Method of birth control.  Menstrual cycle.  Pregnancy history.  Screening You may have the following tests or measurements:  Height, weight, and BMI.  Blood pressure.  Lipid and cholesterol levels. These may be checked every 5 years, or more frequently if you are over 50 years old.  Skin check.  Lung cancer screening. You may have this screening every year starting at age 55 if you have a 30-pack-year history of smoking and currently smoke or have quit within the past 15 years.  Fecal occult blood test (FOBT) of the stool. You may have this test every year starting at age 50.  Flexible sigmoidoscopy or colonoscopy. You may have a sigmoidoscopy every 5 years or a colonoscopy every 10 years starting at age 50.  Hepatitis C blood test.  Hepatitis B blood test.  Sexually transmitted disease (STD) testing.  Diabetes screening. This is done by   checking your blood sugar (glucose) after you have not eaten for a while (fasting). You may have this done every 1-3 years.  Mammogram. This may be done every 1-2 years. Talk to your health care provider about when you should start having regular mammograms. This may depend on whether you have a  family history of breast cancer.  BRCA-related cancer screening. This may be done if you have a family history of breast, ovarian, tubal, or peritoneal cancers.  Pelvic exam and Pap test. This may be done every 3 years starting at age 21. Starting at age 30, this may be done every 5 years if you have a Pap test in combination with an HPV test.  Bone density scan. This is done to screen for osteoporosis. You may have this scan if you are at high risk for osteoporosis.  Discuss your test results, treatment options, and if necessary, the need for more tests with your health care provider. Vaccines Your health care provider may recommend certain vaccines, such as:  Influenza vaccine. This is recommended every year.  Tetanus, diphtheria, and acellular pertussis (Tdap, Td) vaccine. You may need a Td booster every 10 years.  Varicella vaccine. You may need this if you have not been vaccinated.  Zoster vaccine. You may need this after age 60.  Measles, mumps, and rubella (MMR) vaccine. You may need at least one dose of MMR if you were born in 1957 or later. You may also need a second dose.  Pneumococcal 13-valent conjugate (PCV13) vaccine. You may need this if you have certain conditions and were not previously vaccinated.  Pneumococcal polysaccharide (PPSV23) vaccine. You may need one or two doses if you smoke cigarettes or if you have certain conditions.  Meningococcal vaccine. You may need this if you have certain conditions.  Hepatitis A vaccine. You may need this if you have certain conditions or if you travel or work in places where you may be exposed to hepatitis A.  Hepatitis B vaccine. You may need this if you have certain conditions or if you travel or work in places where you may be exposed to hepatitis B.  Haemophilus influenzae type b (Hib) vaccine. You may need this if you have certain conditions.  Talk to your health care provider about which screenings and vaccines you need  and how often you need them. This information is not intended to replace advice given to you by your health care provider. Make sure you discuss any questions you have with your health care provider. Document Released: 09/20/2015 Document Revised: 05/13/2016 Document Reviewed: 06/25/2015 Elsevier Interactive Patient Education  2017 [employer]   

## 2017-01-26 NOTE — Telephone Encounter (Signed)
Pt would like for Dr Tamala Julian to send RX for shingles to the [facility] on [address] [facility]

## 2017-01-26 NOTE — Progress Notes (Signed)
Subjective:    Patient ID: Alexis Shields, female    DOB: [birth year], 61 y.o.   MRN: [id number]  01/26/2017  Annual Exam and Medication Refill (Amlodipine 10 mg, Metformin 500 mg, Aspirin EC 81 mg)   HPI This 61 y.o. female presents for Complete Physical Examination.  Last physical:  10-04-2015 Pap smear:  2015 Mammogram:  01-08-2015 Colonoscopy:  2010; repeat in 10 years. [place].   Eye exam:  06-04-2016 Dental exam:  Every six months.  Immunization History  Administered Date(s) Administered  . Hepatitis B 09/07/2006, 10/08/2006, 03/08/2007  . Td 11/05/2001  . Tdap 08/29/2012   BP Readings from Last 3 Encounters:  01/26/17 125/86  07/28/16 120/90  01/28/16 130/84   Wt Readings from Last 3 Encounters:  01/26/17 189 lb 12.8 oz (86.1 kg)  07/28/16 192 lb 9.6 oz (87.4 kg)  01/28/16 190 lb 6.4 oz (86.4 kg)     Review of Systems  Constitutional: Positive for diaphoresis. Negative for activity change, appetite change, chills, fatigue, fever and unexpected weight change.  HENT: Negative for congestion, dental problem, drooling, ear discharge, ear pain, facial swelling, hearing loss, mouth sores, nosebleeds, postnasal drip, rhinorrhea, sinus pressure, sneezing, sore throat, tinnitus, trouble swallowing and voice change.   Eyes: Negative for photophobia, pain, discharge, redness, itching and visual disturbance.  Respiratory: Negative for apnea, cough, choking, chest tightness, shortness of breath, wheezing and stridor.   Cardiovascular: Positive for chest pain. Negative for palpitations and leg swelling.  Gastrointestinal: Negative for abdominal distention, abdominal pain, anal bleeding, blood in stool, constipation, diarrhea, nausea, rectal pain and vomiting.  Endocrine: Negative for cold intolerance, heat intolerance, polydipsia, polyphagia and polyuria.  Genitourinary: Negative for decreased urine volume, difficulty urinating, dyspareunia, dysuria, enuresis, flank pain,  frequency, genital sores, hematuria, menstrual problem, pelvic pain, urgency, vaginal bleeding, vaginal discharge and vaginal pain.  Musculoskeletal: Positive for back pain. Negative for arthralgias, gait problem, joint swelling, myalgias, neck pain and neck stiffness.  Skin: Negative for color change, pallor, rash and wound.  Allergic/Immunologic: Negative for environmental allergies, food allergies and immunocompromised state.  Neurological: Negative for dizziness, tremors, seizures, syncope, facial asymmetry, speech difficulty, weakness, light-headedness, numbness and headaches.  Hematological: Negative for adenopathy. Does not bruise/bleed easily.  Psychiatric/Behavioral: Negative for agitation, behavioral problems, confusion, decreased concentration, dysphoric mood, hallucinations, self-injury, sleep disturbance and suicidal ideas. The patient is nervous/anxious. The patient is not hyperactive.     Past Medical History:  Diagnosis Date  . Anemia   . Back pain   . GERD (gastroesophageal reflux disease)   . Hyperlipidemia   . Hypertension   . Overweight (BMI 25.0-29.9)   . Type 2 diabetes mellitus ([place])    Past Surgical History:  Procedure Laterality Date  . BREAST LUMPECTOMY     R breast   Allergies  Allergen Reactions  . Fexofenadine Hcl   . Influenza Vaccines   . Lisinopril Cough  . Statins Other (See Comments)    Pt refuses    Social History   Social History  . Marital status: Married    Spouse name: N/A  . Number of children: 1  . Years of education: N/A   Occupational History  . housecleaning     once weekly   Social History Main Topics  . Smoking status: Never Smoker  . Smokeless tobacco: Never Used  . Alcohol use 1.2 oz/week    2 Standard drinks or equivalent per week     Comment: Socially - 1x/mo  . Drug  use: No  . Sexual activity: Yes    Birth control/ protection: Post-menopausal   Other Topics Concern  . Not on file   Social History Narrative    Marital status: married x 8 years; second marriage      Children:  1 son (27); 2 grandchildren (18, 44 twins); no gg      Lives: with husband      Employment: cleans houses; works Thursday      Tobacco;  None      Alcohol: holidays; limits alcohol due to father with alcoholism       Exercise:  None       Seatbelt:  100%; texting none         Family History  Problem Relation Age of Onset  . Kidney failure Mother   . Diabetes Mother   . Alcohol abuse Father   . Liver disease Father   . Lupus Sister   . Mental illness Sister        Bipolar disorder       Objective:    BP 125/86   Pulse 93   Temp 98.9 F (37.2 C) (Oral)   Resp 16   Ht 5' 6.5" (1.689 m)   Wt 189 lb 12.8 oz (86.1 kg)   SpO2 98%   BMI 30.18 kg/m  Physical Exam  Constitutional: She is oriented to person, place, and time. She appears well-developed and well-nourished. No distress.  HENT:  Head: Normocephalic and atraumatic.  Right Ear: External ear normal.  Left Ear: External ear normal.  Nose: Nose normal.  Mouth/Throat: Oropharynx is clear and moist.  Eyes: Conjunctivae and EOM are normal. Pupils are equal, round, and reactive to light.  Neck: Normal range of motion and full passive range of motion without pain. Neck supple. No JVD present. Carotid bruit is not present. No thyromegaly present.  Cardiovascular: Normal rate, regular rhythm and normal heart sounds.  Exam reveals no gallop and no friction rub.   No murmur heard. Pulmonary/Chest: Effort normal and breath sounds normal. She has no wheezes. She has no rales. Right breast exhibits no inverted nipple, no mass, no nipple discharge, no skin change and no tenderness. Left breast exhibits no inverted nipple, no mass, no nipple discharge, no skin change and no tenderness. Breasts are symmetrical.  Abdominal: Soft. Bowel sounds are normal. She exhibits no distension and no mass. There is no tenderness. There is no rebound and no guarding.  Musculoskeletal:        Right shoulder: Normal.       Left shoulder: Normal.       Cervical back: Normal.  Lymphadenopathy:    She has no cervical adenopathy.  Neurological: She is alert and oriented to person, place, and time. She has normal reflexes. No cranial nerve deficit. She exhibits normal muscle tone. Coordination normal.  Skin: Skin is warm and dry. No rash noted. She is not diaphoretic. No erythema. No pallor.  Psychiatric: She has a normal mood and affect. Her behavior is normal. Judgment and thought content normal.  Nursing note and vitals reviewed.   Depression screen [facility] 2/9 01/26/2017 07/28/2016 01/28/2016 10/04/2015 02/28/2015  Decreased Interest 0 0 0 0 0  Down, Depressed, Hopeless 0 0 0 0 0  PHQ - 2 Score 0 0 0 0 0   Fall Risk  01/26/2017 07/28/2016 01/28/2016 10/04/2015 12/19/2014  Falls in the past year? No No No No Yes  Number falls in past yr: - - - - 1  Injury with Fall? - - - - No       Assessment & Plan:   1. Routine physical examination   2. Type 2 diabetes mellitus with diabetic polyneuropathy, without long-term current use of insulin ([place])   3. Essential hypertension   4. Gastroesophageal reflux disease without esophagitis   5. Pure hypercholesterolemia   6. Need for shingles vaccine   7. Cervical cancer screening   8. Breast cancer screening   9. Other chest pain   10. Acute bilateral low back pain without sciatica    -anticipatory guidance provided --- exercise, weight loss, safe driving practices, aspirin 81mg  daily. -obtain age appropriate screening labs and labs for chronic disease management. -new onset chest pain; atypical in nature yet multiple cardiac risk factors; refer to cardiology; to ED for recurrent chest pain. -worsening hot flashes; rx for Effexor provided to help with hot flashes and anxiety. -intermittent recurrent lower back pain; refill of Meloxicam and Robaxin provided; recommend home exercise program.   Orders Placed This Encounter  Procedures    . MM SCREENING BREAST TOMO BILATERAL    Standing Status:   Future    Standing Expiration Date:   03/29/2018    Order Specific Question:   Reason for Exam (SYMPTOM  OR DIAGNOSIS REQUIRED)    Answer:   annual screening    Order Specific Question:   Is the patient pregnant?    Answer:   No    Order Specific Question:   Preferred imaging location?    Answer:   [facility]  . CBC with Differential/Platelet  . Comprehensive metabolic panel    Order Specific Question:   Has the patient fasted?    Answer:   Yes  . Hemoglobin A1c  . Lipid panel    Order Specific Question:   Has the patient fasted?    Answer:   Yes  . Microalbumin, urine  . TSH  . Ambulatory referral to Cardiology    Referral Priority:   Routine    Referral Type:   Consultation    Referral Reason:   Specialty Services Required    Requested Specialty:   Cardiology    Number of Visits Requested:   1  . POCT urinalysis dipstick  . EKG 12-Lead  . HM Diabetes Foot Exam   Meds ordered this encounter  Medications  . Zoster Vac Recomb Adjuvanted [facility]) injection    Sig: Inject 0.5 mLs into the muscle once.    Dispense:  0.5 mL    Refill:  1  . venlafaxine XR (EFFEXOR-XR) 37.5 MG 24 hr capsule    Sig: Take 1 capsule (37.5 mg total) by mouth daily with breakfast.    Dispense:  90 capsule    Refill:  1  . glucose blood (FREESTYLE LITE) test strip    Sig: Test blood sugar once daily. Dx: E11.9    Dispense:  100 each    Refill:  3  . Lancets (FREESTYLE) lancets    Sig: Test blood sugar once daily. Dx: E11.9    Dispense:  100 each    Refill:  3  . metFORMIN (GLUCOPHAGE-XR) 500 MG 24 hr tablet    Sig: Take 3 tablets (1,500 mg total) by mouth daily with breakfast.    Dispense:  270 tablet    Refill:  3  . amLODipine (NORVASC) 10 MG tablet    Sig: Take 1 tablet (10 mg total) by mouth daily.    Dispense:  90 tablet  Refill:  3  . methocarbamol (ROBAXIN) 500 MG tablet    Sig: Take 1 tablet (500 mg total) by  mouth every 8 (eight) hours as needed for muscle spasms.    Dispense:  60 tablet    Refill:  0  . meloxicam (MOBIC) 15 MG tablet    Sig: Take 1 tablet (15 mg total) by mouth daily.    Dispense:  60 tablet    Refill:  0    Return in about 6 months (around 07/29/2017) for recheck high blood pressure, high cholesterol, diabetes.   Loray Akard Elayne Guerin, M.D. Primary Care at [facility] [place] [address] [place],   [address] [phone] phone [phone] fax

## 2017-01-27 LAB — CBC WITH DIFFERENTIAL/PLATELET
BASOS: 0 %
Basophils Absolute: 0 10*3/uL (ref 0.0–0.2)
EOS (ABSOLUTE): 0.1 10*3/uL (ref 0.0–0.4)
EOS: 2 %
HEMOGLOBIN: 13.4 g/dL (ref 11.1–15.9)
Hematocrit: 41.5 % (ref 34.0–46.6)
IMMATURE GRANULOCYTES: 0 %
Immature Grans (Abs): 0 10*3/uL (ref 0.0–0.1)
Lymphocytes Absolute: 2.2 10*3/uL (ref 0.7–3.1)
Lymphs: 37 %
MCH: 26.7 pg (ref 26.6–33.0)
MCHC: 32.3 g/dL (ref 31.5–35.7)
MCV: 83 fL (ref 79–97)
MONOCYTES: 5 %
Monocytes Absolute: 0.3 10*3/uL (ref 0.1–0.9)
NEUTROS PCT: 56 %
Neutrophils Absolute: 3.2 10*3/uL (ref 1.4–7.0)
Platelets: 277 10*3/uL (ref 150–379)
RBC: 5.02 x10E6/uL (ref 3.77–5.28)
RDW: 14.4 % (ref 12.3–15.4)
WBC: 5.8 10*3/uL (ref 3.4–10.8)

## 2017-01-27 LAB — COMPREHENSIVE METABOLIC PANEL
A/G RATIO: 1.7 (ref 1.2–2.2)
ALBUMIN: 4.5 g/dL (ref 3.6–4.8)
ALT: 20 IU/L (ref 0–32)
AST: 17 IU/L (ref 0–40)
Alkaline Phosphatase: 83 IU/L (ref 39–117)
BUN / CREAT RATIO: 16 (ref 12–28)
BUN: 12 mg/dL (ref 8–27)
Bilirubin Total: 0.3 mg/dL (ref 0.0–1.2)
CALCIUM: 10 mg/dL (ref 8.7–10.3)
CO2: 22 mmol/L (ref 18–29)
CREATININE: 0.76 mg/dL (ref 0.57–1.00)
Chloride: 103 mmol/L (ref 96–106)
GFR, EST AFRICAN AMERICAN: 99 mL/min/{1.73_m2} (ref >59)
GFR, EST NON AFRICAN AMERICAN: 86 mL/min/{1.73_m2} (ref >59)
Globulin, Total: 2.7 g/dL (ref 1.5–4.5)
Glucose: 184 mg/dL — ABNORMAL HIGH (ref 65–99)
POTASSIUM: 4.2 mmol/L (ref 3.5–5.2)
SODIUM: 142 mmol/L (ref 134–144)
TOTAL PROTEIN: 7.2 g/dL (ref 6.0–8.5)

## 2017-01-27 LAB — LIPID PANEL
Chol/HDL Ratio: 4.3 ratio (ref 0.0–4.4)
Cholesterol, Total: 226 mg/dL — ABNORMAL HIGH (ref 100–199)
HDL: 52 mg/dL (ref >39)
LDL CALC: 158 mg/dL — AB (ref 0–99)
Triglycerides: 80 mg/dL (ref 0–149)
VLDL CHOLESTEROL CAL: 16 mg/dL (ref 5–40)

## 2017-01-27 LAB — MICROALBUMIN, URINE: MICROALBUM., U, RANDOM: 21.6 ug/mL

## 2017-01-27 LAB — HEMOGLOBIN A1C
ESTIMATED AVERAGE GLUCOSE: 220 mg/dL
Hgb A1c MFr Bld: 9.3 % — ABNORMAL HIGH (ref 4.8–5.6)

## 2017-01-27 LAB — TSH: TSH: 1.9 u[IU]/mL (ref 0.450–4.500)

## 2017-02-02 LAB — PAP IG AND HPV HIGH-RISK
HPV, high-risk: NEGATIVE
PAP SMEAR COMMENT: 0

## 2017-02-15 ENCOUNTER — Telehealth: Payer: Self-pay | Admitting: Family Medicine

## 2017-02-15 NOTE — Telephone Encounter (Signed)
Pt is calling stating that the meds that dr Tamala Julian gave her is causing her to throw up   Best number [phone]

## 2017-02-19 MED ORDER — ESCITALOPRAM OXALATE 5 MG PO TABS: 5.0000 mg | ORAL_TABLET | Freq: Every day | ORAL | 1 refills | Status: DC

## 2017-02-19 NOTE — Telephone Encounter (Signed)
Pt made aware of changes.

## 2017-02-19 NOTE — Telephone Encounter (Signed)
Pt states since starting Effexor on 5/22 she's been nauseous with constipation. States, constipation got worse over the past week so she stopped taking on Sunday. No other sx's noted.  Do you want to prescribe alternative?

## 2017-02-19 NOTE — Telephone Encounter (Signed)
Yes; have patient discontinue Effexor and I have sent in Lexapro 5mg  one at bedtime for hot flashes and anxiety/depression.

## 2017-02-22 ENCOUNTER — Telehealth: Payer: Self-pay | Admitting: Family Medicine

## 2017-02-22 NOTE — Telephone Encounter (Signed)
Juliann Pulse from Dr. Gwyndolyn Saxon [facility]'s office called to let us know pt canceled her appt for tomorrow with them.

## 2017-02-22 NOTE — Telephone Encounter (Signed)
Noted  

## 2017-02-22 NOTE — Telephone Encounter (Signed)
FYI

## 2017-03-17 ENCOUNTER — Other Ambulatory Visit: Payer: Self-pay | Admitting: Cardiology

## 2017-03-17 ENCOUNTER — Ambulatory Visit
Admission: RE | Admit: 2017-03-17 | Discharge: 2017-03-17 | Disposition: A | Discharge status: Home / Self Care | Source: Ambulatory Visit | Attending: Cardiology | Admitting: Cardiology

## 2017-03-17 DIAGNOSIS — R0602 Shortness of breath: Secondary | ICD-10-CM

## 2017-03-23 ENCOUNTER — Ambulatory Visit
Admission: RE | Admit: 2017-03-23 | Discharge: 2017-03-23 | Disposition: A | Discharge status: Home / Self Care | Source: Ambulatory Visit | Attending: Family Medicine | Admitting: Family Medicine

## 2017-03-23 DIAGNOSIS — Z1239 Encounter for other screening for malignant neoplasm of breast: Secondary | ICD-10-CM

## 2017-04-14 ENCOUNTER — Ambulatory Visit (INDEPENDENT_AMBULATORY_CARE_PROVIDER_SITE_OTHER): Admitting: Family Medicine

## 2017-04-14 ENCOUNTER — Encounter: Payer: Self-pay | Admitting: Family Medicine

## 2017-04-14 DIAGNOSIS — E1142 Type 2 diabetes mellitus with diabetic polyneuropathy: Secondary | ICD-10-CM | POA: Diagnosis not present

## 2017-04-14 DIAGNOSIS — Z9119 Patient's noncompliance with other medical treatment and regimen: Secondary | ICD-10-CM | POA: Diagnosis not present

## 2017-04-14 DIAGNOSIS — Z91199 Patient's noncompliance with other medical treatment and regimen due to unspecified reason: Secondary | ICD-10-CM

## 2017-04-14 DIAGNOSIS — I10 Essential (primary) hypertension: Secondary | ICD-10-CM | POA: Diagnosis not present

## 2017-04-14 DIAGNOSIS — N951 Menopausal and female climacteric states: Secondary | ICD-10-CM | POA: Diagnosis not present

## 2017-04-14 DIAGNOSIS — E7800 Pure hypercholesterolemia, unspecified: Secondary | ICD-10-CM | POA: Diagnosis not present

## 2017-04-14 DIAGNOSIS — K219 Gastro-esophageal reflux disease without esophagitis: Secondary | ICD-10-CM

## 2017-04-14 LAB — POCT GLYCOSYLATED HEMOGLOBIN (HGB A1C): HEMOGLOBIN A1C: 9.2

## 2017-04-14 LAB — GLUCOSE, POCT (MANUAL RESULT ENTRY): POC Glucose: 226 mg/dl — AB (ref 70–99)

## 2017-04-14 MED ORDER — CHLORTHALIDONE 25 MG PO TABS: 25.0000 mg | ORAL_TABLET | Freq: Every day | ORAL | 1 refills | Status: DC

## 2017-04-14 NOTE — Patient Instructions (Addendum)
IF you received an x-ray today, you will receive an invoice from [facility]. Please contact [facility] at [phone] with questions or concerns regarding your invoice.   IF you received labwork today, you will receive an invoice from [place]. Please contact [facility] at [phone] with questions or concerns regarding your invoice.   Our billing staff will not be able to assist you with questions regarding bills from these companies.  You will be contacted with the lab results as soon as they are available. The fastest way to get your results is to activate your My Chart account. Instructions are located on the last page of this paperwork. If you have not heard from [place] regarding the results in 2 weeks, please contact this office.      I recommend weight loss, exercise, and low-carbohydrate low-sugar food choices.   You should AVOID: regular sodas, sweetened tea, fruit juices.  You should LIMIT: breads, pastas, rice, potatoes, and desserts/sweets.    I would recommend limiting your total carbohydrate intake per meal to 45 grams; I would limit your total carbohydrate intake per snack to 30 grams.    I would also have a goal of 60 grams of protein intake per day; this would equal 10-15 grams of protein per meal and 5-10 grams of protein per snack.   Carbohydrate Counting for Diabetes Mellitus, Adult Carbohydrate counting is a method for keeping track of how many carbohydrates you eat. Eating carbohydrates naturally increases the amount of sugar (glucose) in the blood. Counting how many carbohydrates you eat helps keep your blood glucose within normal limits, which helps you manage your diabetes (diabetes mellitus). It is important to know how many carbohydrates you can safely have in each meal. This is different for every person. A diet and nutrition specialist (registered dietitian) can help you make a meal plan and calculate how many carbohydrates you should have at  each meal and snack. Carbohydrates are found in the following foods:  Grains, such as breads and cereals.  Dried beans and soy products.  Starchy vegetables, such as potatoes, peas, and corn.  Fruit and fruit juices.  Milk and yogurt.  Sweets and snack foods, such as cake, cookies, candy, chips, and soft drinks.  How do I count carbohydrates? There are two ways to count carbohydrates in food. You can use either of the methods or a combination of both. Reading "Nutrition Facts" on packaged food The "Nutrition Facts" list is included on the labels of almost all packaged foods and beverages in the [place] It includes:  The serving size.  Information about nutrients in each serving, including the grams (g) of carbohydrate per serving.  To use the "Nutrition Facts":  Decide how many servings you will have.  Multiply the number of servings by the number of carbohydrates per serving.  The resulting number is the total amount of carbohydrates that you will be having.  Learning standard serving sizes of other foods When you eat foods containing carbohydrates that are not packaged or do not include "Nutrition Facts" on the label, you need to measure the servings in order to count the amount of carbohydrates:  Measure the foods that you will eat with a food scale or measuring cup, if needed.  Decide how many standard-size servings you will eat.  Multiply the number of servings by 15. Most carbohydrate-rich foods have about 15 g of carbohydrates per serving. ? For example, if you eat 8 oz (170 g) of strawberries, you will  have eaten 2 servings and 30 g of carbohydrates (2 servings x 15 g = 30 g).  For foods that have more than one food mixed, such as soups and casseroles, you must count the carbohydrates in each food that is included.  The following list contains standard serving sizes of common carbohydrate-rich foods. Each of these servings has about 15 g of carbohydrates:    hamburger bun or  English muffin.   oz (15 mL) syrup.   oz (14 g) jelly.  1 slice of bread.  1 six-inch tortilla.  3 oz (85 g) cooked rice or pasta.  4 oz (113 g) cooked dried beans.  4 oz (113 g) starchy vegetable, such as peas, corn, or potatoes.  4 oz (113 g) hot cereal.  4 oz (113 g) mashed potatoes or  of a large baked potato.  4 oz (113 g) canned or frozen fruit.  4 oz (120 mL) fruit juice.  4-6 crackers.  6 chicken nuggets.  6 oz (170 g) unsweetened dry cereal.  6 oz (170 g) plain fat-free yogurt or yogurt sweetened with artificial sweeteners.  8 oz (240 mL) milk.  8 oz (170 g) fresh fruit or one small piece of fruit.  24 oz (680 g) popped popcorn.  Example of carbohydrate counting Sample meal  3 oz (85 g) chicken breast.  6 oz (170 g) brown rice.  4 oz (113 g) corn.  8 oz (240 mL) milk.  8 oz (170 g) strawberries with sugar-free whipped topping. Carbohydrate calculation 1. Identify the foods that contain carbohydrates: ? Rice. ? Corn. ? Milk. ? Strawberries. 2. Calculate how many servings you have of each food: ? 2 servings rice. ? 1 serving corn. ? 1 serving milk. ? 1 serving strawberries. 3. Multiply each number of servings by 15 g: ? 2 servings rice x 15 g = 30 g. ? 1 serving corn x 15 g = 15 g. ? 1 serving milk x 15 g = 15 g. ? 1 serving strawberries x 15 g = 15 g. 4. Add together all of the amounts to find the total grams of carbohydrates eaten: ? 30 g + 15 g + 15 g + 15 g = 75 g of carbohydrates total. This information is not intended to replace advice given to you by your health care provider. Make sure you discuss any questions you have with your health care provider. Document Released: 08/24/2005 Document Revised: 03/13/2016 Document Reviewed: 02/05/2016 Elsevier Interactive Patient Education  [employer].

## 2017-04-14 NOTE — Progress Notes (Signed)
Subjective:    Patient ID: Alexis Shields, female    DOB: 05-07-1956, 61 y.o.   MRN: [id number]  04/14/2017  Diabetes (pt states she has been having  high glucose x 1 month last check was 247) and Dizziness (and blurry vision.)   HPI This 61 y.o. female presents for evaluation of hyperglycemia.  Has been suffering with blurred vision for past two days; sugar 204; best friend has been making pt check sugars; 170s-160s.   Jumped to 190-200s recently.  Best friend started making patient check sugars; had not checked since 01/21/17.  Continues to be non-compliant with Metformin .  Too weak to exercise for the past two weeks.   No Mt. Dew in two months.  No sweet tea.  Ocean Spray cranberry.  Peach drink.   S/p nutritionist x 2; insurance did not cover.   Sugar 244.  Ate cheerios with blueberries. [facility]: tuna salad last night. Supper: chicken salad, water.   Chronic constipation; had started going to bathroom three times per day. Wise who works for [employer].   Epistaxis:  Had to go to ED.     BP Readings from Last 3 Encounters:  01/26/17 125/86  07/28/16 120/90  01/28/16 130/84   Wt Readings from Last 3 Encounters:  01/26/17 189 lb 12.8 oz (86.1 kg)  07/28/16 192 lb 9.6 oz (87.4 kg)  01/28/16 190 lb 6.4 oz (86.4 kg)   Immunization History  Administered Date(s) Administered  . Hepatitis B 09/07/2006, 10/08/2006, 03/08/2007  . Td 11/05/2001  . Tdap 08/29/2012  . Zoster Recombinat (Shingrix) 04/04/2017     Review of Systems  Constitutional: Positive for fatigue. Negative for chills, diaphoresis and fever.  Eyes: Positive for visual disturbance.  Respiratory: Negative for cough and shortness of breath.   Cardiovascular: Negative for chest pain, palpitations and leg swelling.  Gastrointestinal: Positive for constipation. Negative for abdominal pain, diarrhea, nausea and vomiting.  Endocrine: Negative for cold intolerance, heat intolerance, polydipsia, polyphagia and  polyuria.  Neurological: Negative for dizziness, tremors, seizures, syncope, facial asymmetry, speech difficulty, weakness, light-headedness, numbness and headaches.  Psychiatric/Behavioral: Positive for dysphoric mood. The patient is nervous/anxious.     Past Medical History:  Diagnosis Date  . Anemia   . Back pain   . GERD (gastroesophageal reflux disease)   . Hyperlipidemia   . Hypertension   . Overweight (BMI 25.0-29.9)   . Type 2 diabetes mellitus ([place])    Past Surgical History:  Procedure Laterality Date  . BREAST BIOPSY Right    Allergies  Allergen Reactions  . Fexofenadine Hcl   . Influenza Vaccines   . Lisinopril Cough  . Statins Other (See Comments)    Pt refuses    Social History   Social History  . Marital status: Married    Spouse name: N/A  . Number of children: 1  . Years of education: N/A   Occupational History  . housecleaning     once weekly   Social History Main Topics  . Smoking status: Never Smoker  . Smokeless tobacco: Never Used  . Alcohol use 1.2 oz/week    2 Standard drinks or equivalent per week     Comment: Socially - 1x/mo  . Drug use: No  . Sexual activity: Yes    Birth control/ protection: Post-menopausal   Other Topics Concern  . Not on file   Social History Narrative   Marital status: married x 8 years; second marriage      Children:  1 son (74); 2 grandchildren (18, 66 twins); no gg      Lives: with husband      Employment: cleans houses; works Thursday      Tobacco;  None      Alcohol: holidays; limits alcohol due to father with alcoholism       Exercise:  None       Seatbelt:  100%; texting none         Family History  Problem Relation Age of Onset  . Kidney failure Mother   . Diabetes Mother   . Alcohol abuse Father   . Liver disease Father   . Lupus Sister   . Mental illness Sister        Bipolar disorder       Objective:    There were no vitals taken for this visit. Physical Exam  Constitutional:  She is oriented to person, place, and time. She appears well-developed and well-nourished. No distress.  HENT:  Head: Normocephalic and atraumatic.  Right Ear: External ear normal.  Left Ear: External ear normal.  Nose: Nose normal.  Mouth/Throat: Oropharynx is clear and moist.  Eyes: Pupils are equal, round, and reactive to light. Conjunctivae and EOM are normal.  Neck: Normal range of motion. Neck supple. Carotid bruit is not present. No thyromegaly present.  Cardiovascular: Normal rate, regular rhythm, normal heart sounds and intact distal pulses.  Exam reveals no gallop and no friction rub.   No murmur heard. Pulmonary/Chest: Effort normal and breath sounds normal. She has no wheezes. She has no rales.  Abdominal: Soft. Bowel sounds are normal. She exhibits no distension and no mass. There is no tenderness. There is no rebound and no guarding.  Lymphadenopathy:    She has no cervical adenopathy.  Neurological: She is alert and oriented to person, place, and time. No cranial nerve deficit.  Skin: Skin is warm and dry. No rash noted. She is not diaphoretic. No erythema. No pallor.  Psychiatric: She has a normal mood and affect. Her behavior is normal.   Results for orders placed or performed in visit on 04/14/17  Comprehensive metabolic panel  Result Value Ref Range   Glucose 243 (H) 65 - 99 mg/dL   BUN 20 8 - 27 mg/dL   Creatinine, Ser 0.99 0.57 - 1.00 mg/dL   GFR calc non Af Amer 62 >59 mL/min/1.73   GFR calc Af Amer 71 >59 mL/min/1.73   BUN/Creatinine Ratio 20 12 - 28   Sodium 141 134 - 144 mmol/L   Potassium 3.4 (L) 3.5 - 5.2 mmol/L   Chloride 94 (L) 96 - 106 mmol/L   CO2 25 20 - 29 mmol/L   Calcium 10.8 (H) 8.7 - 10.3 mg/dL   Total Protein 8.0 6.0 - 8.5 g/dL   Albumin 4.9 (H) 3.6 - 4.8 g/dL   Globulin, Total 3.1 1.5 - 4.5 g/dL   Albumin/Globulin Ratio 1.6 1.2 - 2.2   Bilirubin Total 0.3 0.0 - 1.2 mg/dL   Alkaline Phosphatase 94 39 - 117 IU/L   AST 18 0 - 40 IU/L   ALT  13 0 - 32 IU/L  CBC with Differential/Platelet  Result Value Ref Range   WBC 5.4 3.4 - 10.8 x10E3/uL   RBC 5.40 (H) 3.77 - 5.28 x10E6/uL   Hemoglobin 14.5 11.1 - 15.9 g/dL   Hematocrit 43.0 34.0 - 46.6 %   MCV 80 79 - 97 fL   MCH 26.9 26.6 - 33.0 pg   MCHC 33.7  31.5 - 35.7 g/dL   RDW 13.3 12.3 - 15.4 %   Platelets 334 150 - 379 x10E3/uL   Neutrophils 63 Not Estab. %   Lymphs 30 Not Estab. %   Monocytes 5 Not Estab. %   Eos 1 Not Estab. %   Basos 1 Not Estab. %   Neutrophils Absolute 3.4 1.4 - 7.0 x10E3/uL   Lymphocytes Absolute 1.6 0.7 - 3.1 x10E3/uL   Monocytes Absolute 0.3 0.1 - 0.9 x10E3/uL   EOS (ABSOLUTE) 0.1 0.0 - 0.4 x10E3/uL   Basophils Absolute 0.0 0.0 - 0.2 x10E3/uL   Immature Granulocytes 0 Not Estab. %   Immature Grans (Abs) 0.0 0.0 - 0.1 x10E3/uL  Lipid panel  Result Value Ref Range   Cholesterol, Total 252 (H) 100 - 199 mg/dL   Triglycerides 81 0 - 149 mg/dL   HDL 51 >39 mg/dL   VLDL Cholesterol Cal 16 5 - 40 mg/dL   LDL Calculated 185 (H) 0 - 99 mg/dL   Chol/HDL Ratio 4.9 (H) 0.0 - 4.4 ratio  POCT glucose (manual entry)  Result Value Ref Range   POC Glucose 226 (A) 70 - 99 mg/dl  POCT glycosylated hemoglobin (Hb A1C)  Result Value Ref Range   Hemoglobin A1C 9.2        Assessment & Plan:   1. Type 2 diabetes mellitus with diabetic polyneuropathy, without long-term current use of insulin ([place])   2. Pure hypercholesterolemia   3. Essential hypertension   4. Gastroesophageal reflux disease without esophagitis   5. Noncompliance   6. Hot flash, menopausal    -uncontrolled and worsening DMII due to non-compliance with Metformin, dietary modification, and exercise.  I recommend weight loss, exercise, and low-carbohydrate low-sugar food choices. You should AVOID: regular sodas, sweetened tea, fruit juices.  You should LIMIT: breads, pastas, rice, potatoes, and desserts/sweets.  I would recommend limiting your total carbohydrate intake per meal to 45 grams; I  would limit your total carbohydrate intake per snack to 30 grams.  I would also have a goal of 60 grams of protein intake per day; this would equal 10-15 grams of protein per meal and 5-10 grams of protein per snack. -recommend weight loss, exercise for 30-60 minutes five days per week; recommend 1200 kcal restriction per day with a minimum of 60 grams of protein per day. -pt agreeable to start chlorthalidone 25mg  daily.   -recommend increasing Metformin to tid-qid.  -close follow-up. -recommend nutrition consultation yet pt refusing at this time; s/p nutrition and diabetic education in the past without benefit. -prolonged face-to-face for 40 minutes with greater than 50% of time dedicated to counseling and coordination of care.    Orders Placed This Encounter  Procedures  . Comprehensive metabolic panel    Order Specific Question:   Has the patient fasted?    Answer:   Yes  . CBC with Differential/Platelet  . Lipid panel    Order Specific Question:   Has the patient fasted?    Answer:   Yes  . POCT glucose (manual entry)  . POCT glycosylated hemoglobin (Hb A1C)   Meds ordered this encounter  Medications  . DISCONTD: chlorthalidone (HYGROTON) 25 MG tablet    Sig: Take 25 mg by mouth daily.  . chlorthalidone (HYGROTON) 25 MG tablet    Sig: Take 1 tablet (25 mg total) by mouth daily.    Dispense:  90 tablet    Refill:  1    No Follow-up on file.   Sumiko Ceasar Hassell Done  Tamala Julian, M.D. Primary Care at [facility] previously [facility] [place] [address] [place], [place]  [address] [phone] phone [phone] fax

## 2017-04-15 LAB — CBC WITH DIFFERENTIAL/PLATELET
BASOS ABS: 0 10*3/uL (ref 0.0–0.2)
Basos: 1 %
EOS (ABSOLUTE): 0.1 10*3/uL (ref 0.0–0.4)
Eos: 1 %
Hematocrit: 43 % (ref 34.0–46.6)
Hemoglobin: 14.5 g/dL (ref 11.1–15.9)
Immature Grans (Abs): 0 10*3/uL (ref 0.0–0.1)
Immature Granulocytes: 0 %
LYMPHS ABS: 1.6 10*3/uL (ref 0.7–3.1)
Lymphs: 30 %
MCH: 26.9 pg (ref 26.6–33.0)
MCHC: 33.7 g/dL (ref 31.5–35.7)
MCV: 80 fL (ref 79–97)
MONOS ABS: 0.3 10*3/uL (ref 0.1–0.9)
Monocytes: 5 %
NEUTROS ABS: 3.4 10*3/uL (ref 1.4–7.0)
Neutrophils: 63 %
PLATELETS: 334 10*3/uL (ref 150–379)
RBC: 5.4 x10E6/uL — ABNORMAL HIGH (ref 3.77–5.28)
RDW: 13.3 % (ref 12.3–15.4)
WBC: 5.4 10*3/uL (ref 3.4–10.8)

## 2017-04-15 LAB — COMPREHENSIVE METABOLIC PANEL
A/G RATIO: 1.6 (ref 1.2–2.2)
ALT: 13 IU/L (ref 0–32)
AST: 18 IU/L (ref 0–40)
Albumin: 4.9 g/dL — ABNORMAL HIGH (ref 3.6–4.8)
Alkaline Phosphatase: 94 IU/L (ref 39–117)
BILIRUBIN TOTAL: 0.3 mg/dL (ref 0.0–1.2)
BUN / CREAT RATIO: 20 (ref 12–28)
BUN: 20 mg/dL (ref 8–27)
CHLORIDE: 94 mmol/L — AB (ref 96–106)
CO2: 25 mmol/L (ref 20–29)
Calcium: 10.8 mg/dL — ABNORMAL HIGH (ref 8.7–10.3)
Creatinine, Ser: 0.99 mg/dL (ref 0.57–1.00)
GFR calc non Af Amer: 62 mL/min/{1.73_m2} (ref >59)
GFR, EST AFRICAN AMERICAN: 71 mL/min/{1.73_m2} (ref >59)
Globulin, Total: 3.1 g/dL (ref 1.5–4.5)
Glucose: 243 mg/dL — ABNORMAL HIGH (ref 65–99)
POTASSIUM: 3.4 mmol/L — AB (ref 3.5–5.2)
SODIUM: 141 mmol/L (ref 134–144)
TOTAL PROTEIN: 8 g/dL (ref 6.0–8.5)

## 2017-04-15 LAB — LIPID PANEL
CHOLESTEROL TOTAL: 252 mg/dL — AB (ref 100–199)
Chol/HDL Ratio: 4.9 ratio — ABNORMAL HIGH (ref 0.0–4.4)
HDL: 51 mg/dL (ref >39)
LDL CALC: 185 mg/dL — AB (ref 0–99)
Triglycerides: 81 mg/dL (ref 0–149)
VLDL Cholesterol Cal: 16 mg/dL (ref 5–40)

## 2017-04-28 ENCOUNTER — Ambulatory Visit: Admitting: Family Medicine

## 2017-04-28 ENCOUNTER — Encounter: Payer: Self-pay | Admitting: Family Medicine

## 2017-04-28 ENCOUNTER — Ambulatory Visit (INDEPENDENT_AMBULATORY_CARE_PROVIDER_SITE_OTHER): Admitting: Family Medicine

## 2017-04-28 VITALS — BP 110/77 | HR 100 | Temp 98.0°F | Resp 16 | Ht 67.32 in | Wt 182.0 lb

## 2017-04-28 DIAGNOSIS — K5901 Slow transit constipation: Secondary | ICD-10-CM | POA: Diagnosis not present

## 2017-04-28 DIAGNOSIS — E1142 Type 2 diabetes mellitus with diabetic polyneuropathy: Secondary | ICD-10-CM

## 2017-04-28 DIAGNOSIS — I10 Essential (primary) hypertension: Secondary | ICD-10-CM | POA: Diagnosis not present

## 2017-04-28 DIAGNOSIS — Z9119 Patient's noncompliance with other medical treatment and regimen: Secondary | ICD-10-CM | POA: Diagnosis not present

## 2017-04-28 DIAGNOSIS — Z91199 Patient's noncompliance with other medical treatment and regimen due to unspecified reason: Secondary | ICD-10-CM | POA: Insufficient documentation

## 2017-04-28 LAB — GLUCOSE, POCT (MANUAL RESULT ENTRY): POC Glucose: 154 mg/dl — AB (ref 70–99)

## 2017-04-28 LAB — POCT URINALYSIS DIP (MANUAL ENTRY)
Bilirubin, UA: NEGATIVE
Blood, UA: NEGATIVE
Ketones, POC UA: NEGATIVE mg/dL
NITRITE UA: NEGATIVE
Protein Ur, POC: NEGATIVE mg/dL
Spec Grav, UA: 1.02 (ref 1.010–1.025)
UROBILINOGEN UA: 0.2 U/dL
pH, UA: 7 (ref 5.0–8.0)

## 2017-04-28 NOTE — Patient Instructions (Addendum)
ADD VEGETABLES TO YOUR DIET.  Diabetes Mellitus and Food It is important for you to manage your blood sugar (glucose) level. Your blood glucose level can be greatly affected by what you eat. Eating healthier foods in the appropriate amounts throughout the day at about the same time each day will help you control your blood glucose level. It can also help slow or prevent worsening of your diabetes mellitus. Healthy eating may even help you improve the level of your blood pressure and reach or maintain a healthy weight. General recommendations for healthful eating and cooking habits include:  Eating meals and snacks regularly. Avoid going long periods of time without eating to lose weight.  Eating a diet that consists mainly of plant-based foods, such as fruits, vegetables, nuts, legumes, and whole grains.  Using low-heat cooking methods, such as baking, instead of high-heat cooking methods, such as deep frying.  Work with your dietitian to make sure you understand how to use the Nutrition Facts information on food labels. How can food affect me? Carbohydrates Carbohydrates affect your blood glucose level more than any other type of food. Your dietitian will help you determine how many carbohydrates to eat at each meal and teach you how to count carbohydrates. Counting carbohydrates is important to keep your blood glucose at a healthy level, especially if you are using insulin or taking certain medicines for diabetes mellitus. Alcohol Alcohol can cause sudden decreases in blood glucose (hypoglycemia), especially if you use insulin or take certain medicines for diabetes mellitus. Hypoglycemia can be a life-threatening condition. Symptoms of hypoglycemia (sleepiness, dizziness, and disorientation) are similar to symptoms of having too much alcohol. If your health care provider has given you approval to drink alcohol, do so in moderation and use the following guidelines:  Women should not have  more than one drink per day, and men should not have more than two drinks per day. One drink is equal to: ? 12 oz of beer. ? 5 oz of wine. ? 1 oz of hard liquor.  Do not drink on an empty stomach.  Keep yourself hydrated. Have water, diet soda, or unsweetened iced tea.  Regular soda, juice, and other mixers might contain a lot of carbohydrates and should be counted.  What foods are not recommended? As you make food choices, it is important to remember that all foods are not the same. Some foods have fewer nutrients per serving than other foods, even though they might have the same number of calories or carbohydrates. It is difficult to get your body what it needs when you eat foods with fewer nutrients. Examples of foods that you should avoid that are high in calories and carbohydrates but low in nutrients include:  Trans fats (most processed foods list trans fats on the Nutrition Facts label).  Regular soda.  Juice.  Candy.  Sweets, such as cake, pie, doughnuts, and cookies.  Fried foods.  What foods can I eat? Eat nutrient-rich foods, which will nourish your body and keep you healthy. The food you should eat also will depend on several factors, including:  The calories you need.  The medicines you take.  Your weight.  Your blood glucose level.  Your blood pressure level.  Your cholesterol level.  You should eat a variety of foods, including:  Protein. ? Lean cuts of meat. ? Proteins low in saturated fats, such as fish, egg whites, and beans. Avoid processed meats.  Fruits and vegetables. ? Fruits and vegetables that may help  control blood glucose levels, such as apples, mangoes, and yams.  Dairy products. ? Choose fat-free or low-fat dairy products, such as milk, yogurt, and cheese.  Grains, bread, pasta, and rice. ? Choose whole grain products, such as multigrain bread, whole oats, and brown rice. These foods may help control blood pressure.  Fats. ? Foods  containing healthful fats, such as nuts, avocado, olive oil, canola oil, and fish.  Does everyone with diabetes mellitus have the same meal plan? Because every person with diabetes mellitus is different, there is not one meal plan that works for everyone. It is very important that you meet with a dietitian who will help you create a meal plan that is just right for you. This information is not intended to replace advice given to you by your health care provider. Make sure you discuss any questions you have with your health care provider. Document Released: 05/21/2005 Document Revised: 01/30/2016 Document Reviewed: 07/21/2013 Elsevier Interactive Patient Education  2017 [employer].   IF you received an x-ray today, you will receive an invoice from [facility]. Please contact [facility] at [phone] with questions or concerns regarding your invoice.   IF you received labwork today, you will receive an invoice from [facility]. Please contact [facility] at [phone] with questions or concerns regarding your invoice.   Our billing staff will not be able to assist you with questions regarding bills from these companies.  You will be contacted with the lab results as soon as they are available. The fastest way to get your results is to activate your My Chart account. Instructions are located on the last page of this paperwork. If you have not heard from [place] regarding the results in 2 weeks, please contact this office.

## 2017-04-28 NOTE — Progress Notes (Signed)
Subjective:    Patient ID: Alexis Shields, female    DOB: 1956/06/22, 61 y.o.   MRN: [id number]  04/28/2017  Diabetes (4 week follow-up) and Hypertension   HPI This 61 y.o. female presents for evaluation of DMII and hypertension.  Since last visit, sugars running 192 8am, 154 9am, 140 9am, 186 7am, 193 10pm, 209 9am, 231 8am,  Blood pressure since last visit 125/87 on 04/17/17.  Previous BP running 143/95-157/97,160/101.   Metformin ER 500mg  three at morning caused vomiting; switched to two every morning and 1 in evening.  Now taking 2 at 9:00am and 1 at 8:00pm.  Has been taking three tablets since last visit.   Taking Amlodipine 10mg  and Chlorthalidone 25mg  without problems.     Still constipated; now taking Magnesium Citrate and enema without improvement.  S/p colonoscopy in 2010; repeat in ten years.  [place]. Had a nice normal bowel movement and regular this morning.    Last visit weighed 184.  Has lost weight.  Not able to exercise.  Last night ate boiled egg, 8 shrimps, cheese, small macaroni.  Drank water last night. Crystal light is too sweet.     BP Readings from Last 3 Encounters:  04/28/17 110/77  01/26/17 125/86  07/28/16 120/90   Wt Readings from Last 3 Encounters:  04/28/17 182 lb (82.6 kg)  01/26/17 189 lb 12.8 oz (86.1 kg)  07/28/16 192 lb 9.6 oz (87.4 kg)   Immunization History  Administered Date(s) Administered  . Hepatitis B 09/07/2006, 10/08/2006, 03/08/2007  . Td 11/05/2001  . Tdap 08/29/2012  . Zoster Recombinat (Shingrix) 04/04/2017    Review of Systems  Constitutional: Positive for fatigue. Negative for chills, diaphoresis and fever.  Eyes: Positive for visual disturbance.  Respiratory: Negative for cough and shortness of breath.   Cardiovascular: Negative for chest pain, palpitations and leg swelling.  Gastrointestinal: Positive for constipation. Negative for abdominal pain, diarrhea, nausea and vomiting.  Endocrine: Negative for cold  intolerance, heat intolerance, polydipsia, polyphagia and polyuria.  Neurological: Negative for dizziness, tremors, seizures, syncope, facial asymmetry, speech difficulty, weakness, light-headedness, numbness and headaches.    Past Medical History:  Diagnosis Date  . Anemia   . Back pain   . GERD (gastroesophageal reflux disease)   . Hyperlipidemia   . Hypertension   . Overweight (BMI 25.0-29.9)   . Type 2 diabetes mellitus ([place])    Past Surgical History:  Procedure Laterality Date  . BREAST BIOPSY Right    Allergies  Allergen Reactions  . Fexofenadine Hcl   . Influenza Vaccines   . Lisinopril Cough  . Statins Other (See Comments)    Pt refuses    Social History   Social History  . Marital status: Married    Spouse name: N/A  . Number of children: 1  . Years of education: N/A   Occupational History  . housecleaning     once weekly   Social History Main Topics  . Smoking status: Never Smoker  . Smokeless tobacco: Never Used  . Alcohol use 1.2 oz/week    2 Standard drinks or equivalent per week     Comment: Socially - 1x/mo  . Drug use: No  . Sexual activity: Yes    Birth control/ protection: Post-menopausal   Other Topics Concern  . Not on file   Social History Narrative   Marital status: married x 8 years; second marriage      Children:  1 son (22); 2 grandchildren (34, 37  twins); no gg      Lives: with husband      Employment: cleans houses; works Thursday      Tobacco;  None      Alcohol: holidays; limits alcohol due to father with alcoholism       Exercise:  None       Seatbelt:  100%; texting none         Family History  Problem Relation Age of Onset  . Kidney failure Mother   . Diabetes Mother   . Alcohol abuse Father   . Liver disease Father   . Lupus Sister   . Mental illness Sister        Bipolar disorder       Objective:    BP 110/77   Pulse 100   Temp 98 F (36.7 C) (Oral)   Resp 16   Ht 5' 7.32" (1.71 m)   Wt 182 lb (82.6  kg)   SpO2 97%   BMI 28.23 kg/m  Physical Exam  Constitutional: She is oriented to person, place, and time. She appears well-developed and well-nourished. No distress.  HENT:  Head: Normocephalic and atraumatic.  Right Ear: External ear normal.  Left Ear: External ear normal.  Nose: Nose normal.  Mouth/Throat: Oropharynx is clear and moist.  Eyes: Pupils are equal, round, and reactive to light. Conjunctivae and EOM are normal.  Neck: Normal range of motion. Neck supple. Carotid bruit is not present. No thyromegaly present.  Cardiovascular: Normal rate, regular rhythm, normal heart sounds and intact distal pulses.  Exam reveals no gallop and no friction rub.   No murmur heard. Pulmonary/Chest: Effort normal and breath sounds normal. She has no wheezes. She has no rales.  Abdominal: Soft. Bowel sounds are normal. She exhibits no distension and no mass. There is no tenderness. There is no rebound and no guarding.  Lymphadenopathy:    She has no cervical adenopathy.  Neurological: She is alert and oriented to person, place, and time. No cranial nerve deficit.  Skin: Skin is warm and dry. No rash noted. She is not diaphoretic. No erythema. No pallor.  Psychiatric: She has a normal mood and affect. Her behavior is normal.   Results for orders placed or performed in visit on 04/28/17  POCT glucose (manual entry)  Result Value Ref Range   POC Glucose 154 (A) 70 - 99 mg/dl  POCT urinalysis dipstick  Result Value Ref Range   Color, UA yellow yellow   Clarity, UA hazy (A) clear   Glucose, UA =250 (A) negative mg/dL   Bilirubin, UA negative negative   Ketones, POC UA negative negative mg/dL   Spec Grav, UA 1.020 1.010 - 1.025   Blood, UA negative negative   pH, UA 7.0 5.0 - 8.0   Protein Ur, POC negative negative mg/dL   Urobilinogen, UA 0.2 0.2 or 1.0 E.U./dL   Nitrite, UA Negative Negative   Leukocytes, UA Large (3+) (A) Negative   No results found. Depression screen [facility] 2/9  04/28/2017 04/14/2017 01/26/2017 07/28/2016 01/28/2016  Decreased Interest 0 0 0 0 0  Down, Depressed, Hopeless 0 0 0 0 0  PHQ - 2 Score 0 0 0 0 0   Fall Risk  04/28/2017 04/14/2017 01/26/2017 07/28/2016 01/28/2016  Falls in the past year? No No No No No  Number falls in past yr: - - - - -  Injury with Fall? - - - - -        Assessment &  Plan:   1. Type 2 diabetes mellitus with diabetic polyneuropathy, without long-term current use of insulin ([place])   2. Essential hypertension   3. Noncompliance    -sugars are improving with improved compliance with Metformin three daily and checking sugars at home. - I recommend weight loss, exercise, and low-carbohydrate low-sugar food choices. You should AVOID: regular sodas, sweetened tea, fruit juices.  You should LIMIT: breads, pastas, rice, potatoes, and desserts/sweets.  I would recommend limiting your total carbohydrate intake per meal to 45 grams; I would limit your total carbohydrate intake per snack to 30 grams.  I would also have a goal of 60 grams of protein intake per day; this would equal 10-15 grams of protein per meal and 5-10 grams of protein per snack. -no changes to management.  -incorporate more vegetables into diet for constipation.  Colonoscopy UTD. -if continues to suffer with nausea with Metformin, add Januvia and decrease Metformin dose.  Orders Placed This Encounter  Procedures  . Comprehensive metabolic panel  . POCT glucose (manual entry)  . POCT urinalysis dipstick   No orders of the defined types were placed in this encounter.   No Follow-up on file.   Keyondre Hepburn Elayne Guerin, M.D. Primary Care at [facility] previously Ur[facility] [place] [address] [place], [place]  [address] [phone] phone [phone] fax

## 2017-04-29 LAB — COMPREHENSIVE METABOLIC PANEL
ALK PHOS: 73 IU/L (ref 39–117)
ALT: 16 IU/L (ref 0–32)
AST: 17 IU/L (ref 0–40)
Albumin/Globulin Ratio: 1.7 (ref 1.2–2.2)
Albumin: 4.7 g/dL (ref 3.6–4.8)
BUN/Creatinine Ratio: 17 (ref 12–28)
BUN: 15 mg/dL (ref 8–27)
Bilirubin Total: 0.2 mg/dL (ref 0.0–1.2)
CO2: 25 mmol/L (ref 20–29)
CREATININE: 0.9 mg/dL (ref 0.57–1.00)
Calcium: 10.7 mg/dL — ABNORMAL HIGH (ref 8.7–10.3)
Chloride: 98 mmol/L (ref 96–106)
GFR calc Af Amer: 80 mL/min/{1.73_m2} (ref >59)
GFR calc non Af Amer: 69 mL/min/{1.73_m2} (ref >59)
GLUCOSE: 196 mg/dL — AB (ref 65–99)
Globulin, Total: 2.7 g/dL (ref 1.5–4.5)
Potassium: 3.5 mmol/L (ref 3.5–5.2)
SODIUM: 142 mmol/L (ref 134–144)
Total Protein: 7.4 g/dL (ref 6.0–8.5)

## 2017-06-01 ENCOUNTER — Ambulatory Visit: Admitting: Family Medicine

## 2017-08-03 ENCOUNTER — Ambulatory Visit: Admitting: Family Medicine

## 2017-09-18 ENCOUNTER — Encounter: Payer: Self-pay | Admitting: Family Medicine

## 2017-09-18 ENCOUNTER — Ambulatory Visit (INDEPENDENT_AMBULATORY_CARE_PROVIDER_SITE_OTHER): Admitting: Family Medicine

## 2017-09-18 ENCOUNTER — Other Ambulatory Visit: Payer: Self-pay

## 2017-09-18 VITALS — BP 130/84 | HR 99 | Temp 98.4°F | Resp 18 | Ht 66.54 in | Wt 166.5 lb

## 2017-09-18 DIAGNOSIS — E1142 Type 2 diabetes mellitus with diabetic polyneuropathy: Secondary | ICD-10-CM | POA: Diagnosis not present

## 2017-09-18 DIAGNOSIS — K219 Gastro-esophageal reflux disease without esophagitis: Secondary | ICD-10-CM | POA: Diagnosis not present

## 2017-09-18 DIAGNOSIS — F430 Acute stress reaction: Secondary | ICD-10-CM | POA: Diagnosis not present

## 2017-09-18 DIAGNOSIS — Z9119 Patient's noncompliance with other medical treatment and regimen: Secondary | ICD-10-CM

## 2017-09-18 DIAGNOSIS — I10 Essential (primary) hypertension: Secondary | ICD-10-CM | POA: Diagnosis not present

## 2017-09-18 DIAGNOSIS — E7800 Pure hypercholesterolemia, unspecified: Secondary | ICD-10-CM | POA: Diagnosis not present

## 2017-09-18 DIAGNOSIS — Z91199 Patient's noncompliance with other medical treatment and regimen due to unspecified reason: Secondary | ICD-10-CM

## 2017-09-18 DIAGNOSIS — Z23 Encounter for immunization: Secondary | ICD-10-CM

## 2017-09-18 DIAGNOSIS — N951 Menopausal and female climacteric states: Secondary | ICD-10-CM

## 2017-09-18 DIAGNOSIS — R634 Abnormal weight loss: Secondary | ICD-10-CM

## 2017-09-18 LAB — GLUCOSE, POCT (MANUAL RESULT ENTRY): POC GLUCOSE: 259 mg/dL — AB (ref 70–99)

## 2017-09-18 LAB — POCT GLYCOSYLATED HEMOGLOBIN (HGB A1C): Hemoglobin A1C: 10.3

## 2017-09-18 MED ORDER — CHLORTHALIDONE 25 MG PO TABS: 25.0000 mg | ORAL_TABLET | Freq: Every day | ORAL | 1 refills | Status: DC

## 2017-09-18 MED ORDER — SITAGLIPTIN PHOS-METFORMIN HCL 50-1000 MG PO TABS: 1.0000 | ORAL_TABLET | Freq: Two times a day (BID) | ORAL | 1 refills | Status: DC

## 2017-09-18 NOTE — Patient Instructions (Addendum)
IF you received an x-ray today, you will receive an invoice from [facility]. Please contact [facility] at [phone] with questions or concerns regarding your invoice.   IF you received labwork today, you will receive an invoice from [place]. Please contact [facility] at [phone] with questions or concerns regarding your invoice.   Our billing staff will not be able to assist you with questions regarding bills from these companies.  You will be contacted with the lab results as soon as they are available. The fastest way to get your results is to activate your My Chart account. Instructions are located on the last page of this paperwork. If you have not heard from [place] regarding the results in 2 weeks, please contact this office.     Diabetes Mellitus and Nutrition When you have diabetes (diabetes mellitus), it is very important to have healthy eating habits because your blood sugar (glucose) levels are greatly affected by what you eat and drink. Eating healthy foods in the appropriate amounts, at about the same times every day, can help you:  Control your blood glucose.  Lower your risk of heart disease.  Improve your blood pressure.  Reach or maintain a healthy weight.  Every person with diabetes is different, and each person has different needs for a meal plan. Your health care provider may recommend that you work with a diet and nutrition specialist (dietitian) to make a meal plan that is best for you. Your meal plan may vary depending on factors such as:  The calories you need.  The medicines you take.  Your weight.  Your blood glucose, blood pressure, and cholesterol levels.  Your activity level.  Other health conditions you have, such as heart or kidney disease.  How do carbohydrates affect me? Carbohydrates affect your blood glucose level more than any other type of food. Eating carbohydrates naturally increases the amount of glucose in your  blood. Carbohydrate counting is a method for keeping track of how many carbohydrates you eat. Counting carbohydrates is important to keep your blood glucose at a healthy level, especially if you use insulin or take certain oral diabetes medicines. It is important to know how many carbohydrates you can safely have in each meal. This is different for every person. Your dietitian can help you calculate how many carbohydrates you should have at each meal and for snack. Foods that contain carbohydrates include:  Bread, cereal, rice, pasta, and crackers.  Potatoes and corn.  Peas, beans, and lentils.  Milk and yogurt.  Fruit and juice.  Desserts, such as cakes, cookies, ice cream, and candy.  How does alcohol affect me? Alcohol can cause a sudden decrease in blood glucose (hypoglycemia), especially if you use insulin or take certain oral diabetes medicines. Hypoglycemia can be a life-threatening condition. Symptoms of hypoglycemia (sleepiness, dizziness, and confusion) are similar to symptoms of having too much alcohol. If your health care provider says that alcohol is safe for you, follow these guidelines:  Limit alcohol intake to no more than 1 drink per day for nonpregnant women and 2 drinks per day for men. One drink equals 12 oz of beer, 5 oz of wine, or 1 oz of hard liquor.  Do not drink on an empty stomach.  Keep yourself hydrated with water, diet soda, or unsweetened iced tea.  Keep in mind that regular soda, juice, and other mixers may contain a lot of sugar and must be counted as carbohydrates.  What are tips for following  this plan? Reading food labels  Start by checking the serving size on the label. The amount of calories, carbohydrates, fats, and other nutrients listed on the label are based on one serving of the food. Many foods contain more than one serving per package.  Check the total grams (g) of carbohydrates in one serving. You can calculate the number of servings of  carbohydrates in one serving by dividing the total carbohydrates by 15. For example, if a food has 30 g of total carbohydrates, it would be equal to 2 servings of carbohydrates.  Check the number of grams (g) of saturated and trans fats in one serving. Choose foods that have low or no amount of these fats.  Check the number of milligrams (mg) of sodium in one serving. Most people should limit total sodium intake to less than 2,300 mg per day.  Always check the nutrition information of foods labeled as "low-fat" or "nonfat". These foods may be higher in added sugar or refined carbohydrates and should be avoided.  Talk to your dietitian to identify your daily goals for nutrients listed on the label. Shopping  Avoid buying canned, premade, or processed foods. These foods tend to be high in fat, sodium, and added sugar.  Shop around the outside edge of the grocery store. This includes fresh fruits and vegetables, bulk grains, fresh meats, and fresh dairy. Cooking  Use low-heat cooking methods, such as baking, instead of high-heat cooking methods like deep frying.  Cook using healthy oils, such as olive, canola, or sunflower oil.  Avoid cooking with butter, cream, or high-fat meats. Meal planning  Eat meals and snacks regularly, preferably at the same times every day. Avoid going long periods of time without eating.  Eat foods high in fiber, such as fresh fruits, vegetables, beans, and whole grains. Talk to your dietitian about how many servings of carbohydrates you can eat at each meal.  Eat 4-6 ounces of lean protein each day, such as lean meat, chicken, fish, eggs, or tofu. 1 ounce is equal to 1 ounce of meat, chicken, or fish, 1 egg, or 1/4 cup of tofu.  Eat some foods each day that contain healthy fats, such as avocado, nuts, seeds, and fish. Lifestyle   Check your blood glucose regularly.  Exercise at least 30 minutes 5 or more days each week, or as told by your health care  provider.  Take medicines as told by your health care provider.  Do not use any products that contain nicotine or tobacco, such as cigarettes and e-cigarettes. If you need help quitting, ask your health care provider.  Work with a Social worker or diabetes educator to identify strategies to manage stress and any emotional and social challenges. What are some questions to ask my health care provider?  Do I need to meet with a diabetes educator?  Do I need to meet with a dietitian?  What number can I call if I have questions?  When are the best times to check my blood glucose? Where to find more information:  American Diabetes Association: diabetes.org/food-and-fitness/food  Academy of Nutrition and Dietetics: PokerClues.dk  [employer] of Diabetes and Digestive and Kidney Diseases (NIH): ContactWire.be Summary  A healthy meal plan will help you control your blood glucose and maintain a healthy lifestyle.  Working with a diet and nutrition specialist (dietitian) can help you make a meal plan that is best for you.  Keep in mind that carbohydrates and alcohol have immediate effects on your blood glucose  levels. It is important to count carbohydrates and to use alcohol carefully. This information is not intended to replace advice given to you by your health care provider. Make sure you discuss any questions you have with your health care provider. Document Released: 05/21/2005 Document Revised: 09/28/2016 Document Reviewed: 09/28/2016 Elsevier Interactive Patient Education  [employer].
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
## 2017-09-20 LAB — COMPREHENSIVE METABOLIC PANEL
A/G RATIO: 1.7 (ref 1.2–2.2)
ALT: 22 IU/L (ref 0–32)
AST: 20 IU/L (ref 0–40)
Albumin: 4.8 g/dL (ref 3.6–4.8)
Alkaline Phosphatase: 65 IU/L (ref 39–117)
BILIRUBIN TOTAL: 0.3 mg/dL (ref 0.0–1.2)
BUN / CREAT RATIO: 12 (ref 12–28)
BUN: 11 mg/dL (ref 8–27)
CHLORIDE: 95 mmol/L — AB (ref 96–106)
CO2: 27 mmol/L (ref 20–29)
Calcium: 10.6 mg/dL — ABNORMAL HIGH (ref 8.7–10.3)
Creatinine, Ser: 0.92 mg/dL (ref 0.57–1.00)
GFR calc non Af Amer: 67 mL/min/{1.73_m2} (ref >59)
GFR, EST AFRICAN AMERICAN: 78 mL/min/{1.73_m2} (ref >59)
GLUCOSE: 278 mg/dL — AB (ref 65–99)
Globulin, Total: 2.9 g/dL (ref 1.5–4.5)
Potassium: 3.7 mmol/L (ref 3.5–5.2)
Sodium: 142 mmol/L (ref 134–144)
TOTAL PROTEIN: 7.7 g/dL (ref 6.0–8.5)

## 2017-09-20 LAB — LIPID PANEL
Chol/HDL Ratio: 4.9 ratio — ABNORMAL HIGH (ref 0.0–4.4)
Cholesterol, Total: 259 mg/dL — ABNORMAL HIGH (ref 100–199)
HDL: 53 mg/dL (ref >39)
LDL Calculated: 188 mg/dL — ABNORMAL HIGH (ref 0–99)
Triglycerides: 91 mg/dL (ref 0–149)
VLDL CHOLESTEROL CAL: 18 mg/dL (ref 5–40)

## 2017-09-20 LAB — CBC WITH DIFFERENTIAL/PLATELET

## 2017-09-20 LAB — PARATHYROID HORMONE, INTACT (NO CA): PTH: 45 pg/mL (ref 15–65)

## 2017-09-21 ENCOUNTER — Ambulatory Visit: Admitting: Urgent Care

## 2017-09-21 DIAGNOSIS — Z0189 Encounter for other specified special examinations: Secondary | ICD-10-CM

## 2017-09-21 NOTE — Progress Notes (Signed)
Nurse visit, I did not examine this patient.

## 2017-09-22 LAB — CBC WITH DIFFERENTIAL/PLATELET
Basophils Absolute: 0 10*3/uL (ref 0.0–0.2)
Basos: 0 %
EOS (ABSOLUTE): 0.1 10*3/uL (ref 0.0–0.4)
Eos: 2 %
HEMATOCRIT: 40.7 % (ref 34.0–46.6)
Hemoglobin: 13.7 g/dL (ref 11.1–15.9)
Immature Grans (Abs): 0 10*3/uL (ref 0.0–0.1)
Immature Granulocytes: 0 %
LYMPHS ABS: 2.8 10*3/uL (ref 0.7–3.1)
Lymphs: 37 %
MCH: 26.8 pg (ref 26.6–33.0)
MCHC: 33.7 g/dL (ref 31.5–35.7)
MCV: 80 fL (ref 79–97)
MONOS ABS: 0.5 10*3/uL (ref 0.1–0.9)
Monocytes: 7 %
Neutrophils Absolute: 4.1 10*3/uL (ref 1.4–7.0)
Neutrophils: 54 %
PLATELETS: 321 10*3/uL (ref 150–379)
RBC: 5.11 x10E6/uL (ref 3.77–5.28)
RDW: 13.9 % (ref 12.3–15.4)
WBC: 7.5 10*3/uL (ref 3.4–10.8)

## 2017-09-28 ENCOUNTER — Telehealth: Payer: Self-pay | Admitting: Family Medicine

## 2017-09-28 NOTE — Telephone Encounter (Signed)
Incoming call from patient transferred from lab. Patient would like lab results and to inform Dr. Tamala Julian that her husband has passed away - likely the reason why her lab numbers are high. Relayed lab results, assisted patient with mychart signup. Provider, Juluis Rainier.

## 2017-12-03 ENCOUNTER — Other Ambulatory Visit: Payer: Self-pay

## 2017-12-03 ENCOUNTER — Telehealth: Payer: Self-pay

## 2017-12-03 ENCOUNTER — Encounter: Payer: Self-pay | Admitting: Physician Assistant

## 2017-12-03 ENCOUNTER — Ambulatory Visit (INDEPENDENT_AMBULATORY_CARE_PROVIDER_SITE_OTHER): Admitting: Physician Assistant

## 2017-12-03 VITALS — BP 110/82 | HR 93 | Temp 97.9°F | Resp 18 | Ht 66.0 in | Wt 159.4 lb

## 2017-12-03 DIAGNOSIS — R109 Unspecified abdominal pain: Secondary | ICD-10-CM | POA: Diagnosis not present

## 2017-12-03 LAB — POCT URINALYSIS DIP (MANUAL ENTRY)
GLUCOSE UA: NEGATIVE mg/dL
Nitrite, UA: NEGATIVE
RBC UA: NEGATIVE
SPEC GRAV UA: 1.02 (ref 1.010–1.025)
UROBILINOGEN UA: 0.2 U/dL
pH, UA: 6 (ref 5.0–8.0)

## 2017-12-03 LAB — POCT CBC
Granulocyte percent: 62.6 %G (ref 37–80)
HCT, POC: 42.3 % (ref 37.7–47.9)
Hemoglobin: 13.8 g/dL (ref 12.2–16.2)
LYMPH, POC: 2 (ref 0.6–3.4)
MCH: 26.5 pg — AB (ref 27–31.2)
MCHC: 32.7 g/dL (ref 31.8–35.4)
MCV: 81.1 fL (ref 80–97)
MID (CBC): 0.2 (ref 0–0.9)
MPV: 7.6 fL (ref 0–99.8)
PLATELET COUNT, POC: 329 10*3/uL (ref 142–424)
POC Granulocyte: 3.7 (ref 2–6.9)
POC LYMPH %: 33.6 % (ref 10–50)
POC MID %: 3.8 % (ref 0–12)
RBC: 5.22 M/uL (ref 4.04–5.48)
RDW, POC: 13.7 %
WBC: 5.9 10*3/uL (ref 4.6–10.2)

## 2017-12-03 LAB — POC MICROSCOPIC URINALYSIS (UMFC): Mucus: ABSENT

## 2017-12-03 MED ORDER — HYDROCODONE-ACETAMINOPHEN 5-325 MG PO TABS: 1.0000 | ORAL_TABLET | Freq: Four times a day (QID) | ORAL | 0 refills | Status: DC | PRN

## 2017-12-03 NOTE — Patient Instructions (Addendum)
Start taking 1/2 of you BP medication for now.  Come back in 5 days. Continue taking robaxin.     IF you received an x-ray today, you will receive an invoice from [facility]. Please contact [facility] at [phone] with questions or concerns regarding your invoice.   IF you received labwork today, you will receive an invoice from [place]. Please contact [facility] at [phone] with questions or concerns regarding your invoice.   Our billing staff will not be able to assist you with questions regarding bills from these companies.  You will be contacted with the lab results as soon as they are available. The fastest way to get your results is to activate your My Chart account. Instructions are located on the last page of this paperwork. If you have not heard from [place] regarding the results in 2 weeks, please contact this office.

## 2017-12-03 NOTE — Telephone Encounter (Signed)
Called [facility] to cancel [facility] because Rx was suppose to go to [facility] on [address]. [facility] confirmed that Rx was canceled. Alexis Shields has already sent new Rx to [facility].

## 2017-12-03 NOTE — Progress Notes (Signed)
12/03/2017 11:28 AM   DOB: 1955-12-06 / MRN: [id number]  SUBJECTIVE:  Alexis Shields is a 62 y.o. female presenting for left-sided flank pain that started after a move.  Patient presented to the ED at [facility] not where a CT abdomen was performed showing bilateral nephrolithiasis.  Patient was treated for a UTI along with tramadol and muscle relaxer.  Patient tells me that nothing is helped and continues to complain of exquisite pain mostly about the left flank.  Denies dysuria, urgency, frequency, hematuria and has been taking Keflex.  She is taking 25 mg chlorthalidone and has been stable on this medication for some time.  She has a history of very well-controlled hypertension.    She is allergic to fexofenadine hcl; influenza vaccines; lisinopril; and statins.   She  has a past medical history of Anemia, Back pain, GERD (gastroesophageal reflux disease), Hyperlipidemia, Hypertension, Overweight (BMI 25.0-29.9), and Type 2 diabetes mellitus ([place]).    She  reports that she has never smoked. She has never used smokeless tobacco. She reports that she drinks about 1.2 oz of alcohol per week. She reports that she does not use drugs. She  reports that she currently engages in sexual activity. She reports using the following method of birth control/protection: Post-menopausal. The patient  has a past surgical history that includes Breast biopsy (Right).  Her family history includes Alcohol abuse in her father; Diabetes in her mother; Kidney failure in her mother; Liver disease in her father; Lupus in her sister; Mental illness in her sister.  Review of Systems  Genitourinary: Negative for dysuria, frequency, hematuria and urgency.  Musculoskeletal: Positive for back pain and myalgias. Negative for falls, joint pain and neck pain.    The problem list and medications were reviewed and updated by myself where necessary and exist elsewhere in the encounter.   OBJECTIVE:  BP 110/82 (BP Location:  Right Arm, Patient Position: Sitting, Cuff Size: Large)   Pulse 93   Temp 97.9 F (36.6 C) (Oral)   Resp 18   Ht 5\' 6"  (1.676 m)   Wt 159 lb 6.4 oz (72.3 kg)   SpO2 95%   BMI 25.73 kg/m   BP Readings from Last 3 Encounters:  12/03/17 110/82  09/18/17 130/84  04/28/17 110/77     Physical Exam  Constitutional: She is active.  Non-toxic appearance.  Cardiovascular: Normal rate.  Pulmonary/Chest: Effort normal. No tachypnea.  Abdominal: There is tenderness (About the left flank.  Bowel sounds normal throughout.).  Neurological: She is alert.  Skin: Skin is warm and dry. She is not diaphoretic. No pallor.    Results for orders placed or performed in visit on 12/03/17 (from the past 72 hour(s))  POCT CBC     Status: Abnormal   Collection Time: 12/03/17 11:02 AM  Result Value Ref Range   WBC 5.9 4.6 - 10.2 K/uL   Lymph, poc 2.0 0.6 - 3.4   POC LYMPH PERCENT 33.6 10 - 50 %L   MID (cbc) 0.2 0 - 0.9   POC MID % 3.8 0 - 12 %M   POC Granulocyte 3.7 2 - 6.9   Granulocyte percent 62.6 37 - 80 %G   RBC 5.22 4.04 - 5.48 M/uL   Hemoglobin 13.8 12.2 - 16.2 g/dL   HCT, POC 42.3 37.7 - 47.9 %   MCV 81.1 80 - 97 fL   MCH, POC 26.5 (A) 27 - 31.2 pg   MCHC 32.7 31.8 - 35.4 g/dL  RDW, POC 13.7 %   Platelet Count, POC 329 142 - 424 K/uL   MPV 7.6 0 - 99.8 fL  POCT urinalysis dipstick     Status: Abnormal   Collection Time: 12/03/17 11:08 AM  Result Value Ref Range   Color, UA yellow yellow   Clarity, UA cloudy (A) clear   Glucose, UA negative negative mg/dL   Bilirubin, UA small (A) negative   Ketones, POC UA small (15) (A) negative mg/dL   Spec Grav, UA 1.020 1.010 - 1.025   Blood, UA negative negative   pH, UA 6.0 5.0 - 8.0   Protein Ur, POC trace (A) negative mg/dL   Urobilinogen, UA 0.2 0.2 or 1.0 E.U./dL   Nitrite, UA Negative Negative   Leukocytes, UA Trace (A) Negative  POCT Microscopic Urinalysis ([facility])     Status: None   Collection Time: 12/03/17 11:16 AM  Result  Value Ref Range   WBC,UR,HPF,POC None None WBC/hpf   RBC,UR,HPF,POC None None RBC/hpf   Bacteria None None, Too numerous to count   Mucus Absent Absent   Epithelial Cells, UR Per Microscopy None None, Too numerous to count cells/hpf   CT abdomen pelvis without contrast 11/27/2017:  IMPRESSION:  1.Tiny nonobstructing bilateral renal stones.  2. Hyperdense renal pyramids which could indicate hyperparathyroidism, medullary sponge kidney and other etiologies. Recommend clinical correlation.  3.Uterine fibroids.   No results found.  ASSESSMENT AND PLAN:  Alexis Shields was seen today for flank pain and follow-up.  Diagnoses and all orders for this visit:  Flank pain: Discogenic, versus muscular versus renal etiology.  Highly doubt pyelonephritis.  CT does show bilateral renal stones and she does take chlorthalidone 25 mg.  Her urine shows a mild dehydration.  I am cutting her chlorthalidone dose in half today.  I will see her back in 5 days to check her progress. -     POCT urinalysis dipstick -     POCT Microscopic Urinalysis ([facility]) -     PTH, Intact and Calcium -     POCT CBC -     Urine Culture -     HYDROcodone-acetaminophen (NORCO) 5-325 MG tablet; Take 1 tablet by mouth every 6 (six) hours as needed for severe pain (May cause constipation). No refills without office visit. -     Care order/instruction:  Other orders -     Discontinue: HYDROcodone-acetaminophen (NORCO) 5-325 MG tablet; Take 1 tablet by mouth every 6 (six) hours as needed for severe pain (May cause constipation). For severe pain only. Do not mix with alcohol, benzodiazepines, muscle relaxer. No refills without office visit.    The patient is advised to call or return to clinic if she does not see an improvement in symptoms, or to seek the care of the closest emergency department if she worsens with the above plan.   Philis Fendt, MHS, PA-C [facility] 12/03/2017 11:28 AM

## 2017-12-03 NOTE — Addendum Note (Signed)
Addended by: Tereasa Coop on: 12/03/2017 12:12 PM   Modules accepted: Orders

## 2017-12-04 LAB — URINE CULTURE: ORGANISM ID, BACTERIA: NO GROWTH

## 2017-12-04 LAB — PTH, INTACT AND CALCIUM
CALCIUM: 10.9 mg/dL — AB (ref 8.7–10.3)
PTH: 24 pg/mL (ref 15–65)

## 2017-12-08 ENCOUNTER — Encounter: Payer: Self-pay | Admitting: Physician Assistant

## 2017-12-08 ENCOUNTER — Other Ambulatory Visit: Payer: Self-pay

## 2017-12-08 ENCOUNTER — Ambulatory Visit (INDEPENDENT_AMBULATORY_CARE_PROVIDER_SITE_OTHER)

## 2017-12-08 ENCOUNTER — Ambulatory Visit (INDEPENDENT_AMBULATORY_CARE_PROVIDER_SITE_OTHER): Admitting: Physician Assistant

## 2017-12-08 VITALS — BP 108/78 | HR 97 | Temp 98.6°F | Resp 18 | Ht 66.0 in | Wt 164.4 lb

## 2017-12-08 DIAGNOSIS — G589 Mononeuropathy, unspecified: Secondary | ICD-10-CM

## 2017-12-08 DIAGNOSIS — R109 Unspecified abdominal pain: Secondary | ICD-10-CM | POA: Diagnosis not present

## 2017-12-08 DIAGNOSIS — R208 Other disturbances of skin sensation: Secondary | ICD-10-CM

## 2017-12-08 DIAGNOSIS — R82998 Other abnormal findings in urine: Secondary | ICD-10-CM

## 2017-12-08 LAB — POCT URINALYSIS DIP (MANUAL ENTRY)
Bilirubin, UA: NEGATIVE
Glucose, UA: NEGATIVE mg/dL
Ketones, POC UA: NEGATIVE mg/dL
NITRITE UA: NEGATIVE
PH UA: 7 (ref 5.0–8.0)
RBC UA: NEGATIVE
Spec Grav, UA: 1.025 (ref 1.010–1.025)
UROBILINOGEN UA: 0.2 U/dL

## 2017-12-08 LAB — POC MICROSCOPIC URINALYSIS (UMFC): Mucus: ABSENT

## 2017-12-08 LAB — POCT CBC
Granulocyte percent: 64.9 %G (ref 37–80)
HCT, POC: 38.1 % (ref 37.7–47.9)
HEMOGLOBIN: 12.6 g/dL (ref 12.2–16.2)
LYMPH, POC: 1.9 (ref 0.6–3.4)
MCH: 26.5 pg — AB (ref 27–31.2)
MCHC: 33 g/dL (ref 31.8–35.4)
MCV: 80.4 fL (ref 80–97)
MID (CBC): 0.3 (ref 0–0.9)
MPV: 7.4 fL (ref 0–99.8)
PLATELET COUNT, POC: 303 10*3/uL (ref 142–424)
POC Granulocyte: 4.2 (ref 2–6.9)
POC LYMPH PERCENT: 30.3 %L (ref 10–50)
POC MID %: 4.8 % (ref 0–12)
RBC: 4.74 M/uL (ref 4.04–5.48)
RDW, POC: 14 %
WBC: 6.4 10*3/uL (ref 4.6–10.2)

## 2017-12-08 MED ORDER — LIDOCAINE 2 % EX GEL: 1.0000 "application " | Freq: Four times a day (QID) | CUTANEOUS | 0 refills | Status: DC | PRN

## 2017-12-08 MED ORDER — GABAPENTIN 100 MG PO CAPS
200.0000 mg | ORAL_CAPSULE | Freq: Two times a day (BID) | ORAL | 3 refills | Status: DC
Start: 2017-12-08 — End: 2018-04-20

## 2017-12-08 NOTE — Patient Instructions (Addendum)
  Pick up the gabapentin along with the lidocaine jelly.  I have labs I'm checking.  Hopefully neuro can perform a block.    IF you received an x-ray today, you will receive an invoice from [facility]. Please contact [facility] at [phone] with questions or concerns regarding your invoice.   IF you received labwork today, you will receive an invoice from [facility]. Please contact [facility] at [phone] with questions or concerns regarding your invoice.   Our billing staff will not be able to assist you with questions regarding bills from these companies.  You will be contacted with the lab results as soon as they are available. The fastest way to get your results is to activate your My Chart account. Instructions are located on the last page of this paperwork. If you have not heard from [place] regarding the results in 2 weeks, please contact this office.

## 2017-12-08 NOTE — Progress Notes (Signed)
l   12/08/2017 10:15 AM   DOB: [birth year] / MRN: [id number]  SUBJECTIVE:  Alexis Shields is a 62 y.o. female presenting for recheck of flank pain. Tells me that the norco has not helped.  She had a CT scan of the abdomen which revealed tiny non obstructing stones along with differential of medullary sponge kidney as well as hyperparathyroidism. Her physical exam was normal aside from exquisite pain about the left flank.    Immunization History  Administered Date(s) Administered  . Hepatitis B 09/07/2006, 10/08/2006, 03/08/2007  . Pneumococcal Polysaccharide-23 09/18/2017  . Td 11/05/2001  . Tdap 08/29/2012  . Zoster Recombinat (Shingrix) 04/04/2017      She is allergic to fexofenadine hcl; influenza vaccines; lisinopril; and statins.   She  has a past medical history of Anemia, Back pain, GERD (gastroesophageal reflux disease), Hyperlipidemia, Hypertension, Overweight (BMI 25.0-29.9), and Type 2 diabetes mellitus ([place]).    She  reports that she has never smoked. She has never used smokeless tobacco. She reports that she drinks about 1.2 oz of alcohol per week. She reports that she does not use drugs. She  reports that she currently engages in sexual activity. She reports using the following method of birth control/protection: Post-menopausal. The patient  has a past surgical history that includes Breast biopsy (Right).  Her family history includes Alcohol abuse in her father; Diabetes in her mother; Kidney failure in her mother; Liver disease in her father; Lupus in her sister; Mental illness in her sister.  Review of Systems  Constitutional: Negative for chills, diaphoresis and fever.  Respiratory: Negative for cough, hemoptysis, sputum production, shortness of breath and wheezing.   Cardiovascular: Negative for chest pain, orthopnea and leg swelling.  Gastrointestinal: Negative for nausea.  Skin: Negative for rash.  Neurological: Negative for dizziness.    The problem list and  medications were reviewed and updated by myself where necessary and exist elsewhere in the encounter.   OBJECTIVE:  BP 108/78 (BP Location: Left Arm, Patient Position: Sitting, Cuff Size: Normal)   Pulse 97   Temp 98.6 F (37 C) (Oral)   Resp 18   Ht 5\' 6"  (1.676 m)   Wt 164 lb 6.4 oz (74.6 kg)   SpO2 98%   BMI 26.53 kg/m   Wt Readings from Last 3 Encounters:  12/08/17 164 lb 6.4 oz (74.6 kg)  12/03/17 159 lb 6.4 oz (72.3 kg)  09/18/17 166 lb 8 oz (75.5 kg)   Temp Readings from Last 3 Encounters:  12/08/17 98.6 F (37 C) (Oral)  12/03/17 97.9 F (36.6 C) (Oral)  09/18/17 98.4 F (36.9 C) (Oral)   BP Readings from Last 3 Encounters:  12/08/17 108/78  12/03/17 110/82  09/18/17 130/84   Pulse Readings from Last 3 Encounters:  12/08/17 97  12/03/17 93  09/18/17 99     Physical Exam  Constitutional: She is active.  Non-toxic appearance.  Cardiovascular: Normal rate, regular rhythm, S1 normal, S2 normal, normal heart sounds and intact distal pulses. Exam reveals no gallop, no friction rub and no decreased pulses.  No murmur heard. Pulmonary/Chest: Effort normal. No stridor. No tachypnea. No respiratory distress. She has no wheezes. She has no rales.  Abdominal: She exhibits no distension.  Musculoskeletal: She exhibits no edema.  Neurological: She is alert.  Skin: Skin is warm and dry. She is not diaphoretic. No pallor.    Results for orders placed or performed in visit on 12/08/17 (from the past 72 hour(s))  POCT urinalysis  dipstick     Status: Abnormal   Collection Time: 12/08/17  8:54 AM  Result Value Ref Range   Color, UA yellow yellow   Clarity, UA cloudy (A) clear   Glucose, UA negative negative mg/dL   Bilirubin, UA negative negative   Ketones, POC UA negative negative mg/dL   Spec Grav, UA 1.025 1.010 - 1.025   Blood, UA negative negative   pH, UA 7.0 5.0 - 8.0   Protein Ur, POC trace (A) negative mg/dL   Urobilinogen, UA 0.2 0.2 or 1.0 E.U./dL    Nitrite, UA Negative Negative   Leukocytes, UA Small (1+) (A) Negative  POCT CBC     Status: Abnormal   Collection Time: 12/08/17  8:55 AM  Result Value Ref Range   WBC 6.4 4.6 - 10.2 K/uL   Lymph, poc 1.9 0.6 - 3.4   POC LYMPH PERCENT 30.3 10 - 50 %L   MID (cbc) 0.3 0 - 0.9   POC MID % 4.8 0 - 12 %M   POC Granulocyte 4.2 2 - 6.9   Granulocyte percent 64.9 37 - 80 %G   RBC 4.74 4.04 - 5.48 M/uL   Hemoglobin 12.6 12.2 - 16.2 g/dL   HCT, POC 38.1 37.7 - 47.9 %   MCV 80.4 80 - 97 fL   MCH, POC 26.5 (A) 27 - 31.2 pg   MCHC 33.0 31.8 - 35.4 g/dL   RDW, POC 14.0 %   Platelet Count, POC 303 142 - 424 K/uL   MPV 7.4 0 - 99.8 fL  POCT Microscopic Urinalysis ([facility])     Status: Abnormal   Collection Time: 12/08/17  9:11 AM  Result Value Ref Range   WBC,UR,HPF,POC Few (A) None WBC/hpf   RBC,UR,HPF,POC None None RBC/hpf   Bacteria Few (A) None, Too numerous to count   Mucus Absent Absent   Epithelial Cells, UR Per Microscopy None None, Too numerous to count cells/hpf    Dg Chest 2 View  Result Date: 12/08/2017 CLINICAL DATA:  Left flank pain. EXAM: CHEST - 2 VIEW COMPARISON:  03/17/2017 FINDINGS: The heart size and mediastinal contours are within normal limits. Both lungs are clear. The visualized skeletal structures are unremarkable. IMPRESSION: Normal chest x-ray. Electronically Signed   By: Marijo Sanes M.D.   On: 12/08/2017 09:56    ASSESSMENT AND PLAN:  Flavia was seen today for flank pain and follow-up.  Diagnoses and all orders for this visit:  Flank pain, acute: She has been seen in the ED and CT scan per my last note and no specific diagnosis was made. EKG and chest xray normal. All basic labs normal. She has exquisite tenderness about the skin. Mild leuks on dip however she has already completed keflex and last culture was negative. Labs pointing away from infectious etiology. Her pain intractable to NSAIDS and norco.  Suprisingly she improved with lidocaine cream.  Question  shingles but no rash and patient has has recently completed the Shingrix.  I've referred her to Dr. Jaynee Eagles in neurology for hyperalgesia/neuronitis. Hopefully a diagnosis can be made.   -     DG Chest 2 View; Future -     EKG 12-Lead -     POCT CBC -     POCT urinalysis dipstick -     Basic metabolic panel -     Uric Acid -     Sedimentation Rate  Urine leukocytes increased -     POCT Microscopic Urinalysis ([facility]) -  Urine Culture  Hyperalgesia -     Varicella Zoster Abs, IgG/IgM  Neuronitis -     Ambulatory referral to Neurology -     Lidocaine 2 % GEL; Apply 1 application topically every 6 (six) hours as needed. -     gabapentin (NEURONTIN) 100 MG capsule; Take 2 capsules (200 mg total) by mouth 2 (two) times daily.    The patient is advised to call or return to clinic if she does not see an improvement in symptoms, or to seek the care of the closest emergency department if she worsens with the above plan.   Philis Fendt, MHS, PA-C [facility] 12/08/2017 10:15 AM

## 2017-12-09 ENCOUNTER — Encounter: Payer: Self-pay | Admitting: Physician Assistant

## 2017-12-09 ENCOUNTER — Ambulatory Visit (INDEPENDENT_AMBULATORY_CARE_PROVIDER_SITE_OTHER): Admitting: Neurology

## 2017-12-09 ENCOUNTER — Encounter: Payer: Self-pay | Admitting: Neurology

## 2017-12-09 VITALS — BP 108/78 | HR 103 | Resp 16 | Ht 66.0 in | Wt 165.8 lb

## 2017-12-09 DIAGNOSIS — R109 Unspecified abdominal pain: Secondary | ICD-10-CM | POA: Diagnosis not present

## 2017-12-09 LAB — BASIC METABOLIC PANEL
BUN / CREAT RATIO: 13 (ref 12–28)
BUN: 10 mg/dL (ref 8–27)
CO2: 21 mmol/L (ref 20–29)
Calcium: 10.1 mg/dL (ref 8.7–10.3)
Chloride: 107 mmol/L — ABNORMAL HIGH (ref 96–106)
Creatinine, Ser: 0.8 mg/dL (ref 0.57–1.00)
GFR calc Af Amer: 92 mL/min/{1.73_m2} (ref >59)
GFR, EST NON AFRICAN AMERICAN: 80 mL/min/{1.73_m2} (ref >59)
GLUCOSE: 160 mg/dL — AB (ref 65–99)
Potassium: 4.1 mmol/L (ref 3.5–5.2)
SODIUM: 147 mmol/L — AB (ref 134–144)

## 2017-12-09 LAB — SEDIMENTATION RATE: Sed Rate: 10 mm/hr (ref 0–40)

## 2017-12-09 LAB — VARICELLA ZOSTER ABS, IGG/IGM
Varicella IgM: <0.91 index (ref 0.00–0.90)
Varicella zoster IgG: >4000 index (ref >165)

## 2017-12-09 LAB — URIC ACID: Uric Acid: 3.5 mg/dL (ref 2.5–7.1)

## 2017-12-09 NOTE — Progress Notes (Signed)
NEUROLOGY CONSULTATION NOTE  Alexis Shields MRN: [id number] DOB: 12-15-1955  Referring provider: Chrissie Noa, PA-C Primary care provider: Reginia Forts, MD  Reason for consult:  Left flank pain  HISTORY OF PRESENT ILLNESS: Alexis Shields is a 62 year old female with type 2 diabetes mellitus, hypertension, hyperlipidemia, back pain and GERD who presents for neuronitis.  History supplemented by PCP and ED note.  A month ago, she moved to an apartment.  At that time, she developed left sided flank pain.  At first she thought she may have pulled something when she was lifting heavy boxes.  However, she now recalls first noticing the pain just before the move.  She reports a severe pain described as "being kicked in the side", located over the left flank over the external oblique muscle.  There is a burning quality as well.  Laying prone or on her right side aggravates it.  Laying on her left side helps relieve it.  The slightest pressure on her superficial fascia is painful.  The pain does not radiate from the spine.  There is no overlying numbness or paresthesias over the skin.  She denies any preceding rash over the area.  She does not have radicular pain down the leg.. She had a CT of the abdomen on 11/27/17 which demonstrated tiny nonobstructing bilateral renal stones.  Hydrocodone, tramadol and Robaxin were ineffective.  Yesterday, she was prescribed gabapentin 200mg  twice daily which is ineffective.  She was also prescribed lidocaine gel, which has not yet been filled.  She has history of back pain.  PAST MEDICAL HISTORY: Past Medical History:  Diagnosis Date  . Anemia   . Back pain   . GERD (gastroesophageal reflux disease)   . Hyperlipidemia   . Hypertension   . Overweight (BMI 25.0-29.9)   . Type 2 diabetes mellitus ([place])     PAST SURGICAL HISTORY: Past Surgical History:  Procedure Laterality Date  . BREAST BIOPSY Right     MEDICATIONS: Current Outpatient  Medications on File Prior to Visit  Medication Sig Dispense Refill  . amLODipine (NORVASC) 10 MG tablet Take 1 tablet (10 mg total) by mouth daily. 90 tablet 3  . aspirin EC 81 MG tablet Take 81 mg by mouth daily.    . chlorthalidone (HYGROTON) 25 MG tablet Take 1 tablet (25 mg total) by mouth daily. 90 tablet 1  . gabapentin (NEURONTIN) 100 MG capsule Take 2 capsules (200 mg total) by mouth 2 (two) times daily. 90 capsule 3  . Lidocaine 2 % GEL Apply 1 application topically every 6 (six) hours as needed. 3 Tube 0  . sitaGLIPtin-metformin (JANUMET) 50-1000 MG tablet Take 1 tablet by mouth 2 (two) times daily with a meal. 60 tablet 1   No current facility-administered medications on file prior to visit.     ALLERGIES: Allergies  Allergen Reactions  . Fexofenadine Hcl   . Influenza Vaccines   . Lisinopril Cough  . Statins Other (See Comments)    Pt refuses    FAMILY HISTORY: Family History  Problem Relation Age of Onset  . Kidney failure Mother   . Diabetes Mother   . Alcohol abuse Father   . Liver disease Father   . Lupus Sister   . Mental illness Sister        Bipolar disorder    SOCIAL HISTORY: Social History   Socioeconomic History  . Marital status: Married    Spouse name: Mortimer Fries  . Number of children: 1  .  Years of education: Not on file  . Highest education level: Not on file  Occupational History  . Occupation: housecleaning    Comment: once weekly  Social Needs  . Financial resource strain: Not on file  . Food insecurity:    Worry: Not on file    Inability: Not on file  . Transportation needs:    Medical: Not on file    Non-medical: Not on file  Tobacco Use  . Smoking status: Never Smoker  . Smokeless tobacco: Never Used  Substance and Sexual Activity  . Alcohol use: Yes    Alcohol/week: 1.2 oz    Types: 2 Standard drinks or equivalent per week    Comment: Socially - 1x/mo  . Drug use: No  . Sexual activity: Yes    Birth control/protection:  Post-menopausal  Lifestyle  . Physical activity:    Days per week: Not on file    Minutes per session: Not on file  . Stress: Not on file  Relationships  . Social connections:    Talks on phone: Not on file    Gets together: Not on file    Attends religious service: Not on file    Active member of club or organization: Not on file    Attends meetings of clubs or organizations: Not on file    Relationship status: Not on file  . Intimate partner violence:    Fear of current or ex partner: Not on file    Emotionally abused: Not on file    Physically abused: Not on file    Forced sexual activity: Not on file  Other Topics Concern  . Not on file  Social History Narrative   Marital status: married x 8 years; second marriage      Children:  1 son (80); 2 grandchildren (18, 1 twins); no gg      Lives: with husband      Employment: cleans houses; works Thursday      Tobacco;  None      Alcohol: holidays; limits alcohol due to father with alcoholism       Exercise:  None       Seatbelt:  100%; texting none       REVIEW OF SYSTEMS: Constitutional: No fevers, chills, or sweats, no generalized fatigue, change in appetite Eyes: No visual changes, double vision, eye pain Ear, nose and throat: No hearing loss, ear pain, nasal congestion, sore throat Cardiovascular: No chest pain, palpitations Respiratory:  No shortness of breath at rest or with exertion, wheezes GastrointestinaI: No nausea, vomiting, diarrhea, abdominal pain, fecal incontinence Genitourinary:  No dysuria, urinary retention or frequency Musculoskeletal:  No neck pain, back pain Integumentary: No rash, pruritus, skin lesions Neurological: as above Psychiatric: No depression, insomnia, anxiety Endocrine: No palpitations, fatigue, diaphoresis, mood swings, change in appetite, change in weight, increased thirst Hematologic/Lymphatic:  No purpura, petechiae. Allergic/Immunologic: no itchy/runny eyes, nasal congestion,  recent allergic reactions, rashes  PHYSICAL EXAM: Vitals:   12/09/17 1251  BP: 108/78  Pulse: (!) 103  Resp: 16  SpO2: 98%   General: No acute distress.  Patient appears well-groomed.  Head:  Normocephalic/atraumatic Eyes:  fundi examined but not visualized Neck: supple, no paraspinal tenderness, full range of motion Back: Mild left paraspinal tenderness Heart: regular rate and rhythm Lungs: Clear to auscultation bilaterally. Vascular: No carotid bruits. Neurological Exam: Mental status: alert and oriented to person, place, and time, recent and remote memory intact, fund of knowledge intact, attention and concentration intact, speech  fluent and not dysarthric, language intact. Cranial nerves: CN I: not tested CN II: pupils equal, round and reactive to light, visual fields intact CN III, IV, VI:  full range of motion, no nystagmus, no ptosis CN V: facial sensation intact CN VII: upper and lower face symmetric CN VIII: hearing intact CN IX, X: gag intact, uvula midline CN XI: sternocleidomastoid and trapezius muscles intact CN XII: tongue midline Bulk & Tone: normal, no fasciculations. Motor:  5/5 throughout  Sensation:  Pinprick, temperature and vibration sensation intact. Deep Tendon Reflexes:  2+ throughout, toes downgoing.  Finger to nose testing:  Without dysmetria.  Heel to shin:  Without dysmetria.  Gait:  Normal station and stride.  Able to turn and tandem walk. Romberg negative.  IMPRESSION: Left flank pain.  Etiology unknown.  It is not consistent with neuropathic pain.  It is a localized pain.  It does not follow a dermatomal distribution to suggest radicular etiology.  Unfortunately, I have no further recommendations as far as evaluation and treatment.  Metta Clines, DO  CC:  Reginia Forts, MD  Chrissie Noa, PA-C

## 2017-12-09 NOTE — Patient Instructions (Signed)
Unfortunately, I have no explanation for your pain.  It does not follow any nerve distribution.

## 2017-12-11 LAB — URINE CULTURE

## 2017-12-13 ENCOUNTER — Other Ambulatory Visit: Payer: Self-pay | Admitting: Physician Assistant

## 2017-12-13 ENCOUNTER — Ambulatory Visit: Admitting: Physician Assistant

## 2017-12-13 MED ORDER — CIPROFLOXACIN HCL 500 MG PO TABS: 500.0000 mg | ORAL_TABLET | Freq: Two times a day (BID) | ORAL | 0 refills | Status: DC

## 2017-12-13 NOTE — Progress Notes (Signed)
Mysterious flank pain with no overt etiology.  Now patient has growing bacteria.  She did take Keflex previously which may have been affecting previous cultures..  Starting Cipro for 10 days.  Patient is already seen neurology given the possibility of shingles due to exquisite pain.

## 2018-01-03 ENCOUNTER — Encounter: Admitting: Family Medicine

## 2018-01-14 ENCOUNTER — Other Ambulatory Visit: Payer: Self-pay | Admitting: Family Medicine

## 2018-01-28 ENCOUNTER — Other Ambulatory Visit: Payer: Self-pay

## 2018-01-28 ENCOUNTER — Encounter: Payer: Self-pay | Admitting: Family Medicine

## 2018-01-28 ENCOUNTER — Ambulatory Visit (INDEPENDENT_AMBULATORY_CARE_PROVIDER_SITE_OTHER): Admitting: Family Medicine

## 2018-01-28 VITALS — BP 118/78 | HR 99 | Temp 98.0°F | Resp 16 | Ht 66.93 in | Wt 158.0 lb

## 2018-01-28 DIAGNOSIS — R0789 Other chest pain: Secondary | ICD-10-CM

## 2018-01-28 DIAGNOSIS — Z91199 Patient's noncompliance with other medical treatment and regimen due to unspecified reason: Secondary | ICD-10-CM

## 2018-01-28 DIAGNOSIS — K219 Gastro-esophageal reflux disease without esophagitis: Secondary | ICD-10-CM

## 2018-01-28 DIAGNOSIS — N951 Menopausal and female climacteric states: Secondary | ICD-10-CM

## 2018-01-28 DIAGNOSIS — F4321 Adjustment disorder with depressed mood: Secondary | ICD-10-CM | POA: Diagnosis not present

## 2018-01-28 DIAGNOSIS — E1142 Type 2 diabetes mellitus with diabetic polyneuropathy: Secondary | ICD-10-CM

## 2018-01-28 DIAGNOSIS — Z0000 Encounter for general adult medical examination without abnormal findings: Secondary | ICD-10-CM | POA: Diagnosis not present

## 2018-01-28 DIAGNOSIS — E7800 Pure hypercholesterolemia, unspecified: Secondary | ICD-10-CM

## 2018-01-28 DIAGNOSIS — I10 Essential (primary) hypertension: Secondary | ICD-10-CM

## 2018-01-28 DIAGNOSIS — Z9119 Patient's noncompliance with other medical treatment and regimen: Secondary | ICD-10-CM

## 2018-01-28 LAB — POCT URINALYSIS DIP (MANUAL ENTRY)
BILIRUBIN UA: NEGATIVE
BILIRUBIN UA: NEGATIVE mg/dL
GLUCOSE UA: NEGATIVE mg/dL
Nitrite, UA: NEGATIVE
Protein Ur, POC: NEGATIVE mg/dL
RBC UA: NEGATIVE
Spec Grav, UA: 1.02 (ref 1.010–1.025)
Urobilinogen, UA: 0.2 E.U./dL
pH, UA: 6.5 (ref 5.0–8.0)

## 2018-01-28 LAB — POCT GLYCOSYLATED HEMOGLOBIN (HGB A1C): Hemoglobin A1C: 7.1 % — AB (ref 4.0–5.6)

## 2018-01-28 MED ORDER — AMLODIPINE BESYLATE 5 MG PO TABS: 5.0000 mg | ORAL_TABLET | Freq: Every day | ORAL | 1 refills | Status: DC

## 2018-01-28 MED ORDER — SITAGLIPTIN PHOS-METFORMIN HCL 50-1000 MG PO TABS: 1.0000 | ORAL_TABLET | Freq: Two times a day (BID) | ORAL | 1 refills | Status: DC

## 2018-01-28 MED ORDER — CHLORTHALIDONE 25 MG PO TABS: 25.0000 mg | ORAL_TABLET | Freq: Every day | ORAL | 1 refills | Status: DC

## 2018-01-28 NOTE — Progress Notes (Signed)
Subjective:    Patient ID: Alexis Shields, female    DOB: 30-Mar-1956, 62 y.o.   MRN: [id number]  01/28/2018  Annual Exam    HPI This 62 y.o. female presents for COMPLETE PHYSICAL EXAMINATION.  Last physical:  01-26-2017 Pap smear:  01-26-2017 WNL; HPV negative. Mammogram:  2018 Colonoscopy:  2010 Eye exam:  Alois Cliche 2018. Dental exam:  11/2017.  No exam data present  BP Readings from Last 3 Encounters:  01/28/18 118/78  12/09/17 108/78  12/08/17 108/78   Wt Readings from Last 3 Encounters:  01/28/18 158 lb (71.7 kg)  12/09/17 165 lb 12.8 oz (75.2 kg)  12/08/17 164 lb 6.4 oz (74.6 kg)   Immunization History  Administered Date(s) Administered  . Hepatitis B 09/07/2006, 10/08/2006, 03/08/2007  . Pneumococcal Polysaccharide-23 09/18/2017  . Td 11/05/2001  . Tdap 08/29/2012  . Zoster Recombinat (Shingrix) 04/04/2017   Health Maintenance  Topic Date Due  . OPHTHALMOLOGY EXAM  06/04/2017  . INFLUENZA VACCINE  05/21/2018 (Originally 04/07/2018)  . HEMOGLOBIN A1C  07/31/2018  . COLONOSCOPY  01/16/2019  . FOOT EXAM  01/29/2019  . URINE MICROALBUMIN  01/29/2019  . MAMMOGRAM  03/24/2019  . PAP SMEAR  01/27/2020  . TETANUS/TDAP  08/29/2022  . PNEUMOCOCCAL POLYSACCHARIDE VACCINE (2) 09/18/2022  . Hepatitis C Screening  Completed  . HIV Screening  Completed   Review of Systems  Constitutional: Positive for appetite change and unexpected weight change. Negative for activity change, chills, diaphoresis, fatigue and fever.  HENT: Negative for congestion, dental problem, drooling, ear discharge, ear pain, facial swelling, hearing loss, mouth sores, nosebleeds, postnasal drip, rhinorrhea, sinus pressure, sneezing, sore throat, tinnitus, trouble swallowing and voice change.   Eyes: Negative for photophobia, pain, discharge, redness, itching and visual disturbance.  Respiratory: Negative for apnea, cough, choking, chest tightness, shortness of breath, wheezing and stridor.     Cardiovascular: Positive for chest pain. Negative for palpitations and leg swelling.  Gastrointestinal: Negative for abdominal distention, abdominal pain, anal bleeding, blood in stool, constipation, diarrhea, nausea, rectal pain and vomiting.  Endocrine: Negative for cold intolerance, heat intolerance, polydipsia, polyphagia and polyuria.  Genitourinary: Negative for decreased urine volume, difficulty urinating, dyspareunia, dysuria, enuresis, flank pain, frequency, genital sores, hematuria, menstrual problem, pelvic pain, urgency, vaginal bleeding, vaginal discharge and vaginal pain.  Musculoskeletal: Negative for arthralgias, back pain, gait problem, joint swelling, myalgias, neck pain and neck stiffness.  Skin: Negative for color change, pallor, rash and wound.  Allergic/Immunologic: Negative for environmental allergies, food allergies and immunocompromised state.  Neurological: Negative for dizziness, tremors, seizures, syncope, facial asymmetry, speech difficulty, weakness, light-headedness, numbness and headaches.  Hematological: Negative for adenopathy. Does not bruise/bleed easily.  Psychiatric/Behavioral: Positive for dysphoric mood. Negative for agitation, behavioral problems, confusion, decreased concentration, hallucinations, self-injury, sleep disturbance and suicidal ideas. The patient is not nervous/anxious and is not hyperactive.     Past Medical History:  Diagnosis Date  . Anemia   . Back pain   . GERD (gastroesophageal reflux disease)   . Hyperlipidemia   . Hypertension   . Overweight (BMI 25.0-29.9)   . Type 2 diabetes mellitus ([place])    Past Surgical History:  Procedure Laterality Date  . BREAST BIOPSY Right    Allergies  Allergen Reactions  . Fexofenadine Hcl   . Influenza Vaccines   . Lisinopril Cough  . Statins Other (See Comments)    Pt refuses   Current Outpatient Medications on File Prior to Visit  Medication Sig Dispense Refill  .  aspirin EC 81 MG  tablet Take 81 mg by mouth daily.    Marland Kitchen gabapentin (NEURONTIN) 100 MG capsule Take 2 capsules (200 mg total) by mouth 2 (two) times daily. 90 capsule 3   No current facility-administered medications on file prior to visit.    Social History   Socioeconomic History  . Marital status: Married    Spouse name: Mortimer Fries  . Number of children: 1  . Years of education: Not on file  . Highest education level: Not on file  Occupational History  . Occupation: housecleaning    Comment: once weekly  Social Needs  . Financial resource strain: Not on file  . Food insecurity:    Worry: Not on file    Inability: Not on file  . Transportation needs:    Medical: Not on file    Non-medical: Not on file  Tobacco Use  . Smoking status: Never Smoker  . Smokeless tobacco: Never Used  Substance and Sexual Activity  . Alcohol use: Yes    Alcohol/week: 1.2 oz    Types: 2 Standard drinks or equivalent per week    Comment: Socially - 1x/mo  . Drug use: No  . Sexual activity: Not Currently    Birth control/protection: Post-menopausal  Lifestyle  . Physical activity:    Days per week: Not on file    Minutes per session: Not on file  . Stress: Not on file  Relationships  . Social connections:    Talks on phone: Not on file    Gets together: Not on file    Attends religious service: Not on file    Active member of club or organization: Not on file    Attends meetings of clubs or organizations: Not on file    Relationship status: Not on file  . Intimate partner violence:    Fear of current or ex partner: Not on file    Emotionally abused: Not on file    Physically abused: Not on file    Forced sexual activity: Not on file  Other Topics Concern  . Not on file  Social History Narrative   Marital status: widowed after married x 8 years; second marriage      Children:  1 son (21); 2 grandchildren (18, 91 twins); no gg      Lives: alone      Employment: unemployed in 2019.       Tobacco;  None       Alcohol: holidays; limits alcohol due to father with alcoholism       Exercise:  None       Seatbelt:  100%; texting none      Family History  Problem Relation Age of Onset  . Kidney failure Mother   . Diabetes Mother   . Alcohol abuse Father   . Liver disease Father   . Lupus Sister   . Mental illness Sister        Bipolar disorder       Objective:    BP 118/78   Pulse 99   Temp 98 F (36.7 C) (Oral)   Resp 16   Ht 5' 6.93" (1.7 m)   Wt 158 lb (71.7 kg)   SpO2 95%   BMI 24.80 kg/m  Physical Exam  Constitutional: She is oriented to person, place, and time. She appears well-developed and well-nourished. No distress.  HENT:  Head: Normocephalic and atraumatic.  Right Ear: External ear normal.  Left Ear: External ear normal.  Nose:  Nose normal.  Mouth/Throat: Oropharynx is clear and moist.  Eyes: Pupils are equal, round, and reactive to light. Conjunctivae and EOM are normal.  Neck: Normal range of motion and full passive range of motion without pain. Neck supple. No JVD present. Carotid bruit is not present. No thyromegaly present.  Cardiovascular: Normal rate, regular rhythm and normal heart sounds. Exam reveals no gallop and no friction rub.  No murmur heard. Pulmonary/Chest: Effort normal and breath sounds normal. She has no wheezes. She has no rales. Right breast exhibits no inverted nipple, no mass, no nipple discharge, no skin change and no tenderness. Left breast exhibits no inverted nipple, no mass, no nipple discharge, no skin change and no tenderness. No breast swelling, tenderness, discharge or bleeding. Breasts are symmetrical.  Abdominal: Soft. Bowel sounds are normal. She exhibits no distension and no mass. There is no tenderness. There is no rebound and no guarding.  Musculoskeletal:       Right shoulder: Normal.       Left shoulder: Normal.       Cervical back: Normal.  Lymphadenopathy:    She has no cervical adenopathy.  Neurological: She is alert and  oriented to person, place, and time. She has normal reflexes. No cranial nerve deficit. She exhibits normal muscle tone. Coordination normal.  Skin: Skin is warm and dry. No rash noted. She is not diaphoretic. No erythema. No pallor.  Psychiatric: She has a normal mood and affect. Her behavior is normal. Judgment and thought content normal.  Nursing note and vitals reviewed.  No results found. Depression screen [facility] 2/9 01/28/2018 12/09/2017 12/08/2017 12/03/2017 04/28/2017  Decreased Interest 2 0 0 0 0  Down, Depressed, Hopeless 2 0 0 0 0  PHQ - 2 Score 4 0 0 0 0  Altered sleeping 0 - - - -  Tired, decreased energy 0 - - - -  Change in appetite 0 - - - -  Feeling bad or failure about yourself  0 - - - -  Trouble concentrating 0 - - - -  Moving slowly or fidgety/restless 0 - - - -  Suicidal thoughts 0 - - - -  PHQ-9 Score 4 - - - -   Fall Risk  01/28/2018 12/09/2017 12/08/2017 12/03/2017 09/18/2017  Falls in the past year? No No No No No  Number falls in past yr: - - - - -  Injury with Fall? - - - - -        Assessment & Plan:   1. Routine physical examination   2. Essential hypertension   3. Gastroesophageal reflux disease without esophagitis   4. Type 2 diabetes mellitus with diabetic polyneuropathy, without long-term current use of insulin ([place])   5. Pure hypercholesterolemia   6. Other chest pain   7. Noncompliance   8. Hot flash, menopausal   9. Grief reaction     -anticipatory guidance provided --- exercise, weight loss, safe driving practices, aspirin 81mg  daily. -obtain age appropriate screening labs and labs for chronic disease management. -atypical chest pain: recent onset in the past six months; multiple cardiac risk factors; refer to cardiology; to ED for recurrence. -grief reaction: recent death of husband after prolonged hospitalization following MVA.  Counseling provided.  Coping moderately well at this time.    Orders Placed This Encounter  Procedures  . CBC with  Differential/Platelet  . Comprehensive metabolic panel    Order Specific Question:   Has the patient fasted?    Answer:  No  . Lipid panel    Order Specific Question:   Has the patient fasted?    Answer:   No  . TSH  . Microalbumin, urine  . Ambulatory referral to Cardiology    Referral Priority:   Routine    Referral Type:   Consultation    Referral Reason:   Specialty Services Required    Requested Specialty:   Cardiology    Number of Visits Requested:   1  . POCT urinalysis dipstick  . POCT glycosylated hemoglobin (Hb A1C)   Meds ordered this encounter  Medications  . amLODipine (NORVASC) 5 MG tablet    Sig: Take 1 tablet (5 mg total) by mouth daily.    Dispense:  90 tablet    Refill:  1  . chlorthalidone (HYGROTON) 25 MG tablet    Sig: Take 1 tablet (25 mg total) by mouth daily.    Dispense:  90 tablet    Refill:  1  . sitaGLIPtin-metformin (JANUMET) 50-1000 MG tablet    Sig: Take 1 tablet by mouth 2 (two) times daily with a meal.    Dispense:  180 tablet    Refill:  1    Return in about 3 months (around 04/30/2018) for follow-up chronic medical conditions ZOE STALLINGS, follow-up chronic medical conditions.   Cailyn Houdek Elayne Guerin, M.D. Primary Care at [facility] previously [facility] [address] [place], [place]  [address] [phone] phone [phone] fax

## 2018-01-28 NOTE — Patient Instructions (Addendum)
IF you received an x-ray today, you will receive an invoice from [facility]. Please contact [facility] at [phone] with questions or concerns regarding your invoice.   IF you received labwork today, you will receive an invoice from [facility]. Please contact [facility] at [phone] with questions or concerns regarding your invoice.   Our billing staff will not be able to assist you with questions regarding bills from these companies.  You will be contacted with the lab results as soon as they are available. The fastest way to get your results is to activate your My Chart account. Instructions are located on the last page of this paperwork. If you have not heard from [place] regarding the results in 2 weeks, please contact this office.      Preventive Care 40-64 Years, Female Preventive care refers to lifestyle choices and visits with your health care provider that can promote health and wellness. What does preventive care include?  A yearly physical exam. This is also called an annual well check.  Dental exams once or twice a year.  Routine eye exams. Ask your health care provider how often you should have your eyes checked.  Personal lifestyle choices, including: ? Daily care of your teeth and gums. ? Regular physical activity. ? Eating a healthy diet. ? Avoiding tobacco and drug use. ? Limiting alcohol use. ? Practicing safe sex. ? Taking low-dose aspirin daily starting at age 34. ? Taking vitamin and mineral supplements as recommended by your health care provider. What happens during an annual well check? The services and screenings done by your health care provider during your annual well check will depend on your age, overall health, lifestyle risk factors, and family history of disease. Counseling Your health care provider may ask you questions about your:  Alcohol use.  Tobacco use.  Drug use.  Emotional well-being.  Home and relationship  well-being.  Sexual activity.  Eating habits.  Work and work Statistician.  Method of birth control.  Menstrual cycle.  Pregnancy history.  Screening You may have the following tests or measurements:  Height, weight, and BMI.  Blood pressure.  Lipid and cholesterol levels. These may be checked every 5 years, or more frequently if you are over 65 years old.  Skin check.  Lung cancer screening. You may have this screening every year starting at age 102 if you have a 30-pack-year history of smoking and currently smoke or have quit within the past 15 years.  Fecal occult blood test (FOBT) of the stool. You may have this test every year starting at age 33.  Flexible sigmoidoscopy or colonoscopy. You may have a sigmoidoscopy every 5 years or a colonoscopy every 10 years starting at age 23.  Hepatitis C blood test.  Hepatitis B blood test.  Sexually transmitted disease (STD) testing.  Diabetes screening. This is done by checking your blood sugar (glucose) after you have not eaten for a while (fasting). You may have this done every 1-3 years.  Mammogram. This may be done every 1-2 years. Talk to your health care provider about when you should start having regular mammograms. This may depend on whether you have a family history of breast cancer.  BRCA-related cancer screening. This may be done if you have a family history of breast, ovarian, tubal, or peritoneal cancers.  Pelvic exam and Pap test. This may be done every 3 years starting at age 3. Starting at age 55, this may be done every 5 years if  you have a Pap test in combination with an HPV test.  Bone density scan. This is done to screen for osteoporosis. You may have this scan if you are at high risk for osteoporosis.  Discuss your test results, treatment options, and if necessary, the need for more tests with your health care provider. Vaccines Your health care provider may recommend certain vaccines, such  as:  Influenza vaccine. This is recommended every year.  Tetanus, diphtheria, and acellular pertussis (Tdap, Td) vaccine. You may need a Td booster every 10 years.  Varicella vaccine. You may need this if you have not been vaccinated.  Zoster vaccine. You may need this after age 77.  Measles, mumps, and rubella (MMR) vaccine. You may need at least one dose of MMR if you were born in 1957 or later. You may also need a second dose.  Pneumococcal 13-valent conjugate (PCV13) vaccine. You may need this if you have certain conditions and were not previously vaccinated.  Pneumococcal polysaccharide (PPSV23) vaccine. You may need one or two doses if you smoke cigarettes or if you have certain conditions.  Meningococcal vaccine. You may need this if you have certain conditions.  Hepatitis A vaccine. You may need this if you have certain conditions or if you travel or work in places where you may be exposed to hepatitis A.  Hepatitis B vaccine. You may need this if you have certain conditions or if you travel or work in places where you may be exposed to hepatitis B.  Haemophilus influenzae type b (Hib) vaccine. You may need this if you have certain conditions.  Talk to your health care provider about which screenings and vaccines you need and how often you need them. This information is not intended to replace advice given to you by your health care provider. Make sure you discuss any questions you have with your health care provider. Document Released: 09/20/2015 Document Revised: 05/13/2016 Document Reviewed: 06/25/2015 Elsevier Interactive Patient Education  [employer].

## 2018-01-29 LAB — LIPID PANEL
Chol/HDL Ratio: 4.3 ratio (ref 0.0–4.4)
Cholesterol, Total: 238 mg/dL — ABNORMAL HIGH (ref 100–199)
HDL: 55 mg/dL (ref >39)
LDL Calculated: 169 mg/dL — ABNORMAL HIGH (ref 0–99)
TRIGLYCERIDES: 71 mg/dL (ref 0–149)
VLDL CHOLESTEROL CAL: 14 mg/dL (ref 5–40)

## 2018-01-29 LAB — CBC WITH DIFFERENTIAL/PLATELET
BASOS ABS: 0 10*3/uL (ref 0.0–0.2)
BASOS: 0 %
EOS (ABSOLUTE): 0.1 10*3/uL (ref 0.0–0.4)
Eos: 2 %
Hematocrit: 37.9 % (ref 34.0–46.6)
Hemoglobin: 12.7 g/dL (ref 11.1–15.9)
Immature Grans (Abs): 0 10*3/uL (ref 0.0–0.1)
Immature Granulocytes: 0 %
LYMPHS ABS: 2.2 10*3/uL (ref 0.7–3.1)
Lymphs: 40 %
MCH: 26.8 pg (ref 26.6–33.0)
MCHC: 33.5 g/dL (ref 31.5–35.7)
MCV: 80 fL (ref 79–97)
MONOS ABS: 0.3 10*3/uL (ref 0.1–0.9)
Monocytes: 6 %
NEUTROS ABS: 2.8 10*3/uL (ref 1.4–7.0)
Neutrophils: 52 %
PLATELETS: 330 10*3/uL (ref 150–450)
RBC: 4.74 x10E6/uL (ref 3.77–5.28)
RDW: 14.5 % (ref 12.3–15.4)
WBC: 5.4 10*3/uL (ref 3.4–10.8)

## 2018-01-29 LAB — COMPREHENSIVE METABOLIC PANEL
A/G RATIO: 1.5 (ref 1.2–2.2)
ALK PHOS: 67 IU/L (ref 39–117)
ALT: 7 IU/L (ref 0–32)
AST: 11 IU/L (ref 0–40)
Albumin: 4.4 g/dL (ref 3.6–4.8)
BUN/Creatinine Ratio: 18 (ref 12–28)
BUN: 16 mg/dL (ref 8–27)
CHLORIDE: 97 mmol/L (ref 96–106)
CO2: 28 mmol/L (ref 20–29)
Calcium: 10.7 mg/dL — ABNORMAL HIGH (ref 8.7–10.3)
Creatinine, Ser: 0.87 mg/dL (ref 0.57–1.00)
GFR calc Af Amer: 83 mL/min/{1.73_m2} (ref >59)
GFR calc non Af Amer: 72 mL/min/{1.73_m2} (ref >59)
GLUCOSE: 144 mg/dL — AB (ref 65–99)
Globulin, Total: 2.9 g/dL (ref 1.5–4.5)
Potassium: 3.5 mmol/L (ref 3.5–5.2)
Sodium: 140 mmol/L (ref 134–144)
Total Protein: 7.3 g/dL (ref 6.0–8.5)

## 2018-01-29 LAB — MICROALBUMIN, URINE: Microalbumin, Urine: 5.5 ug/mL

## 2018-01-29 LAB — TSH: TSH: 1.31 u[IU]/mL (ref 0.450–4.500)

## 2018-01-31 ENCOUNTER — Encounter: Payer: Self-pay | Admitting: Family Medicine

## 2018-04-09 ENCOUNTER — Encounter: Payer: Self-pay | Admitting: Family Medicine

## 2018-04-11 ENCOUNTER — Encounter: Payer: Self-pay | Admitting: *Deleted

## 2018-04-19 NOTE — Progress Notes (Signed)
Chief Complaint  Patient presents with  . chronic medical conditions    follow up -former smith pt    HPI  Hypertension: Patient here for follow-up of elevated blood pressure. She is not exercising and is adherent to low salt diet.  Blood pressure is well controlled at home. Use of agents associated with hypertension: none. History of target organ damage: none. She denies chest pain, shortness of breath or ankle edema. She denies dizziness and fatigue.  She is walking daily and tracking it with fitbit BP Readings from Last 3 Encounters:  04/20/18 120/82  01/28/18 118/78  12/09/17 108/78     Diabetes Mellitus: Patient presents for follow up of diabetes.  Home sugars: patient does not check sugars. She reports that she is spending much of her time sitting and watching tv. She denies dizziness, hypoglycemia, polyuria, polydipsia, polyphagia  Lab Results  Component Value Date   HGBA1C 7.1 (A) 01/28/2018      Past Medical History:  Diagnosis Date  . Anemia   . Back pain   . GERD (gastroesophageal reflux disease)   . Hyperlipidemia   . Hypertension   . Overweight (BMI 25.0-29.9)   . Type 2 diabetes mellitus ([place])     Current Outpatient Medications  Medication Sig Dispense Refill  . amLODipine (NORVASC) 5 MG tablet Take 1 tablet (5 mg total) by mouth daily. 90 tablet 1  . aspirin EC 81 MG tablet Take 81 mg by mouth daily.    . chlorthalidone (HYGROTON) 25 MG tablet Take 1 tablet (25 mg total) by mouth daily. 90 tablet 1  . sitaGLIPtin-metformin (JANUMET) 50-1000 MG tablet Take 1 tablet by mouth 2 (two) times daily with a meal. 180 tablet 1   No current facility-administered medications for this visit.     Allergies:  Allergies  Allergen Reactions  . Fexofenadine Hcl   . Influenza Vaccines   . Lisinopril Cough  . Statins Other (See Comments)    Pt refuses    Past Surgical History:  Procedure Laterality Date  . BREAST BIOPSY Right     Social History    Socioeconomic History  . Marital status: Married    Spouse name: Mortimer Fries  . Number of children: 1  . Years of education: Not on file  . Highest education level: Not on file  Occupational History  . Occupation: housecleaning    Comment: once weekly  Social Needs  . Financial resource strain: Not on file  . Food insecurity:    Worry: Not on file    Inability: Not on file  . Transportation needs:    Medical: Not on file    Non-medical: Not on file  Tobacco Use  . Smoking status: Never Smoker  . Smokeless tobacco: Never Used  Substance and Sexual Activity  . Alcohol use: Yes    Alcohol/week: 2.0 standard drinks    Types: 2 Standard drinks or equivalent per week    Comment: Socially - 1x/mo  . Drug use: No  . Sexual activity: Not Currently    Birth control/protection: Post-menopausal  Lifestyle  . Physical activity:    Days per week: Not on file    Minutes per session: Not on file  . Stress: Not on file  Relationships  . Social connections:    Talks on phone: Not on file    Gets together: Not on file    Attends religious service: Not on file    Active member of club or organization: Not on file  Attends meetings of clubs or organizations: Not on file    Relationship status: Not on file  Other Topics Concern  . Not on file  Social History Narrative   Marital status: widowed after married x 8 years; second marriage      Children:  1 son (81); 2 grandchildren (18, 50 twins); no gg      Lives: alone      Employment: unemployed in 2019.       Tobacco;  None      Alcohol: holidays; limits alcohol due to father with alcoholism       Exercise:  None       Seatbelt:  100%; texting none       Family History  Problem Relation Age of Onset  . Kidney failure Mother   . Diabetes Mother   . Alcohol abuse Father   . Liver disease Father   . Lupus Sister   . Mental illness Sister        Bipolar disorder     ROS Review of Systems See HPI Constitution: No fevers or  chills No malaise No diaphoresis Skin: No rash or itching Eyes: no blurry vision, no double vision GU: no dysuria or hematuria Neuro: no dizziness or headaches all others reviewed and negative   Objective: Vitals:   04/20/18 0805  BP: 120/82  Pulse: 81  Resp: 17  Temp: 98.8 F (37.1 C)  TempSrc: Oral  SpO2: 97%  Weight: 165 lb (74.8 kg)  Height: 5' 6.93" (1.7 m)    Physical Exam  Constitutional: She is oriented to person, place, and time. She appears well-developed and well-nourished.  HENT:  Head: Normocephalic and atraumatic.  Eyes: Conjunctivae and EOM are normal.  Cardiovascular: Normal rate, regular rhythm and normal heart sounds.  No murmur heard. Pulmonary/Chest: Effort normal and breath sounds normal. No stridor. No respiratory distress. She has no wheezes.  Neurological: She is alert and oriented to person, place, and time.  Skin: Skin is warm. Capillary refill takes less than 2 seconds.  Psychiatric: She has a normal mood and affect. Her behavior is normal. Judgment and thought content normal.    Assessment and Plan Tamalyn was seen today for chronic medical conditions.  Diagnoses and all orders for this visit:  Essential hypertension Patient's blood pressure is at goal of 139/89 or less. Condition is stable. Continue current medications and treatment plan. I recommend that you exercise for 30-45 minutes 5 days a week. I also recommend a balanced diet with fruits and vegetables every day, lean meats, and little fried foods. The DASH diet (you can find this online) is a good example of this. Will do drug holiday from amlodipine and if it remains stable will discontinue   Type 2 diabetes mellitus with diabetic polyneuropathy, without long-term current use of insulin ([place]) - refilled medications Reviewed labs cpm  Other orders -     sitaGLIPtin-metformin (JANUMET) 50-1000 MG tablet; Take 1 tablet by mouth 2 (two) times daily with a meal. -     chlorthalidone  (HYGROTON) 25 MG tablet; Take 1 tablet (25 mg total) by mouth daily.  A total of 30 minutes were spent face-to-face with the patient during this encounter and over half of that time was spent on counseling and coordination of care.    [place]

## 2018-04-20 ENCOUNTER — Encounter: Payer: Self-pay | Admitting: Family Medicine

## 2018-04-20 ENCOUNTER — Ambulatory Visit (INDEPENDENT_AMBULATORY_CARE_PROVIDER_SITE_OTHER): Admitting: Family Medicine

## 2018-04-20 ENCOUNTER — Other Ambulatory Visit: Payer: Self-pay

## 2018-04-20 VITALS — BP 120/82 | HR 81 | Temp 98.8°F | Resp 17 | Ht 66.93 in | Wt 165.0 lb

## 2018-04-20 DIAGNOSIS — E1142 Type 2 diabetes mellitus with diabetic polyneuropathy: Secondary | ICD-10-CM

## 2018-04-20 DIAGNOSIS — I10 Essential (primary) hypertension: Secondary | ICD-10-CM | POA: Diagnosis not present

## 2018-04-20 MED ORDER — CHLORTHALIDONE 25 MG PO TABS: 25.0000 mg | ORAL_TABLET | Freq: Every day | ORAL | 1 refills | Status: DC

## 2018-04-20 MED ORDER — SITAGLIPTIN PHOS-METFORMIN HCL 50-1000 MG PO TABS: 1.0000 | ORAL_TABLET | Freq: Two times a day (BID) | ORAL | 1 refills | Status: DC

## 2018-04-20 NOTE — Patient Instructions (Addendum)
Please call and ask for Alexis Shields If you blood pressure is greater than 140/90 consistently then call and let us know     IF you received an x-ray today, you will receive an invoice from [facility] Radiology. Please contact [facility] Radiology at [phone] with questions or concerns regarding your invoice.   IF you received labwork today, you will receive an invoice from [place]. Please contact [facility] at [phone] with questions or concerns regarding your invoice.   Our billing staff will not be able to assist you with questions regarding bills from these companies.  You will be contacted with the lab results as soon as they are available. The fastest way to get your results is to activate your My Chart account. Instructions are located on the last page of this paperwork. If you have not heard from [place] regarding the results in 2 weeks, please contact this office.     How to Take Your Blood Pressure Blood pressure is a measurement of how strongly your blood is pressing against the walls of your arteries. Arteries are blood vessels that carry blood from your heart throughout your body. Your health care provider takes your blood pressure at each office visit. You can also take your own blood pressure at home with a blood pressure machine. You may need to take your own blood pressure:  To confirm a diagnosis of high blood pressure (hypertension).  To monitor your blood pressure over time.  To make sure your blood pressure medicine is working.  Supplies needed: To take your blood pressure, you will need a blood pressure machine. You can buy a blood pressure machine, or blood pressure monitor, at most drugstores or online. There are several types of home blood pressure monitors. When choosing one, consider the following:  Choose a monitor that has an arm cuff.  Choose a monitor that wraps snugly around your upper arm. You should be able to fit only one finger between your arm and the  cuff.  Do not choose a monitor that measures your blood pressure from your wrist or finger.  Your health care provider can suggest a reliable monitor that will meet your needs. How to prepare To get the most accurate reading, avoid the following for 30 minutes before you check your blood pressure:  Drinking caffeine.  Drinking alcohol.  Eating.  Smoking.  Exercising.  Five minutes before you check your blood pressure:  Empty your bladder.  Sit quietly without talking in a dining chair, rather than in a soft couch or armchair.  How to take your blood pressure To check your blood pressure, follow the instructions in the manual that came with your blood pressure monitor. If you have a digital blood pressure monitor, the instructions may be as follows: 1. Sit up straight. 2. Place your feet on the floor. Do not cross your ankles or legs. 3. Rest your left arm at the level of your heart on a table or desk or on the arm of a chair. 4. Pull up your shirt sleeve. 5. Wrap the blood pressure cuff around the upper part of your left arm, 1 inch (2.5 cm) above your elbow. It is best to wrap the cuff around bare skin. 6. Fit the cuff snugly around your arm. You should be able to place only one finger between the cuff and your arm. 7. Position the cord inside the groove of your elbow. 8. Press the power button. 9. Sit quietly while the cuff inflates and deflates. 10. Read  the digital reading on the monitor screen and write it down (record it). 11. Wait 2-3 minutes, then repeat the steps, starting at step 1.  What does my blood pressure reading mean? A blood pressure reading consists of a higher number over a lower number. Ideally, your blood pressure should be below 120/80. The first ("top") number is called the systolic pressure. It is a measure of the pressure in your arteries as your heart beats. The second ("bottom") number is called the diastolic pressure. It is a measure of the pressure  in your arteries as the heart relaxes. Blood pressure is classified into four stages. The following are the stages for adults who do not have a short-term serious illness or a chronic condition. Systolic pressure and diastolic pressure are measured in a unit called mm Hg. Normal  Systolic pressure: below 789.  Diastolic pressure: below 80. Elevated  Systolic pressure: 381-017.  Diastolic pressure: below 80. Hypertension stage 1  Systolic pressure: 510-258.  Diastolic pressure: 52-77. Hypertension stage 2  Systolic pressure: 824 or above.  Diastolic pressure: 90 or above. You can have prehypertension or hypertension even if only the systolic or only the diastolic number in your reading is higher than normal. Follow these instructions at home:  Check your blood pressure as often as recommended by your health care provider.  Take your monitor to the next appointment with your health care provider to make sure: ? That you are using it correctly. ? That it provides accurate readings.  Be sure you understand what your goal blood pressure numbers are.  Tell your health care provider if you are having any side effects from blood pressure medicine. Contact a health care provider if:  Your blood pressure is consistently high. Get help right away if:  Your systolic blood pressure is higher than 180.  Your diastolic blood pressure is higher than 110. This information is not intended to replace advice given to you by your health care provider. Make sure you discuss any questions you have with your health care provider. Document Released: 01/31/2016 Document Revised: 04/14/2016 Document Reviewed: 01/31/2016 Elsevier Interactive Patient Education  [employer].

## 2018-04-27 NOTE — Progress Notes (Signed)
Cardiology Office Note   Date:  04/28/2018   ID:  Alexis Shields, DOB April 11, 1956, MRN [id number]  PCP:  Alexis Moron, MD  Cardiologist:   No primary care provider on file. Referring:  Alexis Moron, MD  Chief Complaint  Patient presents with  . Shortness of Breath      History of Present Illness: Alexis Shields is a 62 y.o. female who is referred by Alexis Moron, MD for evaluation of chest pain  SOB   she had some mild shortness of breath and chest discomfort last year.  The patient is new to [place] but I do see that there was an echocardiogram with another cardiology practice that demonstrated NL EF and mild concentric hypertrophy.  She also reports that she had a negative perfusion study but I do not have these results.  However, she is not bothered by this.  She recently was started on chlorthalidone and she very much wants to come off of this medicine that she was told she needed to see the cardiologist first.  She thinks she is gotten some cramping and some abdominal side pain with the chlorthalidone.  She tried to stop it but then she became nervous and wanted to be seen here first.  She actually denies any cardiovascular symptoms.  Unfortunately her husband was killed in an accident earlier this year and she is been moved from her house to an apartment so she does not get as much activity she does not have yard.  He does do some walking and she does not bring on any symptoms.  Denies any chest pressure, neck or arm discomfort.  She may have a little discomfort when she gets done with a long walk but this is unchanged from last year.  She is not having any PND or orthopnea.  Is not having any palpitations, presyncope or syncope.   Past Medical History:  Diagnosis Date  . Anemia   . Back pain   . GERD (gastroesophageal reflux disease)   . Hyperlipidemia   . Hypertension   . Overweight (BMI 25.0-29.9)   . Type 2 diabetes mellitus ([place])     Past Surgical History:    Procedure Laterality Date  . BREAST BIOPSY Right      Current Outpatient Medications  Medication Sig Dispense Refill  . aspirin EC 81 MG tablet Take 81 mg by mouth daily.    . sitaGLIPtin-metformin (JANUMET) 50-1000 MG tablet Take 1 tablet by mouth 2 (two) times daily with a meal. 180 tablet 1   No current facility-administered medications for this visit.     Allergies:   Fexofenadine hcl; Influenza vaccines; Lisinopril; and Statins    Social History:  The patient  reports that she has never smoked. She has never used smokeless tobacco. She reports that she drinks about 2.0 standard drinks of alcohol per week. She reports that she does not use drugs.   Family History:  The patient's family history includes Alcohol abuse in her father; Diabetes in her mother; Kidney failure in her mother; Liver disease in her father; Lupus in her sister; Mental illness in her sister.    ROS:  Please see the history of present illness.   Otherwise, review of systems are positive for none.   All other systems are reviewed and negative.    PHYSICAL EXAM: VS:  BP 118/72 (BP Location: Left Arm, Patient Position: Sitting)   Pulse 79   Ht 5\' 6"  (1.676 m)  Wt 168 lb 3.2 oz (76.3 kg)   BMI 27.15 kg/m  , BMI Body mass index is 27.15 kg/m. GENERAL:  Well appearing HEENT:  Pupils equal round and reactive, fundi not visualized, oral mucosa unremarkable NECK:  No jugular venous distention, waveform within normal limits, carotid upstroke brisk and symmetric, no bruits, no thyromegaly LYMPHATICS:  No cervical, inguinal adenopathy LUNGS:  Clear to auscultation bilaterally BACK:  No CVA tenderness CHEST:  Unremarkable HEART:  PMI not displaced or sustained,S1 and S2 within normal limits, no S3, no S4, no clicks, no rubs, no murmurs ABD:  Flat, positive bowel sounds normal in frequency in pitch, no bruits, no rebound, no guarding, no midline pulsatile mass, no hepatomegaly, no splenomegaly EXT:  2 plus pulses  throughout, no edema, no cyanosis no clubbing SKIN:  No rashes no nodules NEURO:  Cranial nerves II through XII grossly intact, motor grossly intact throughout PSYCH:  Cognitively intact, oriented to person place and time    EKG:  EKG is ordered today. The ekg ordered today demonstrates sinus rhythm, rate 79, axis within normal limits, intervals within normal limits, nonspecific T wave flattening, chest leads.   Recent Labs: 01/28/2018: ALT 7; BUN 16; Creatinine, Ser 0.87; Hemoglobin 12.7; Platelets 330; Potassium 3.5; Sodium 140; TSH 1.310    Lipid Panel    Component Value Date/Time   CHOL 238 (H) 01/28/2018 0926   TRIG 71 01/28/2018 0926   HDL 55 01/28/2018 0926   CHOLHDL 4.3 01/28/2018 0926   CHOLHDL 4.1 07/28/2016 0827   VLDL 13 07/28/2016 0827   LDLCALC 169 (H) 01/28/2018 0926   LDLDIRECT 153.3 08/29/2012 0955      Wt Readings from Last 3 Encounters:  04/28/18 168 lb 3.2 oz (76.3 kg)  04/20/18 165 lb (74.8 kg)  01/28/18 158 lb (71.7 kg)      Other studies Reviewed: Additional studies/ records that were reviewed today include: echo. Review of the above records demonstrates:  Please see elsewhere in the note.     ASSESSMENT AND PLAN:  CHEST PAIN:   The patient has had some mild exertional chest discomfort that has been evaluated.  Is not a significant issue and I do not think further testing is indicated.  She will continue with risk reduction.  HTN: She wants to come off the chlorthalidone and I think that is reasonable.  She will stop this and I gave her strict instructions on how to keep a blood pressure diary.  She results and we can suggest further meds based on that.  DM: Her blood sugar is much better controlled and was previously and is followed by Alexis Moron, MD  Current medicines are reviewed at length with the patient today.  The patient does not have concerns regarding medicines.  The following changes have been made:  no change  Labs/ tests  ordered today include: None  Orders Placed This Encounter  Procedures  . EKG 12-Lead     Disposition:   FU with me as needed.     Signed, Minus Breeding, MD  04/28/2018 11:20 AM    [place]

## 2018-04-28 ENCOUNTER — Ambulatory Visit (INDEPENDENT_AMBULATORY_CARE_PROVIDER_SITE_OTHER): Admitting: Cardiology

## 2018-04-28 ENCOUNTER — Encounter: Payer: Self-pay | Admitting: Cardiology

## 2018-04-28 VITALS — BP 118/72 | HR 79 | Ht 66.0 in | Wt 168.2 lb

## 2018-04-28 DIAGNOSIS — I159 Secondary hypertension, unspecified: Secondary | ICD-10-CM | POA: Diagnosis not present

## 2018-04-28 DIAGNOSIS — R079 Chest pain, unspecified: Secondary | ICD-10-CM | POA: Diagnosis not present

## 2018-04-28 DIAGNOSIS — R002 Palpitations: Secondary | ICD-10-CM | POA: Diagnosis not present

## 2018-04-28 NOTE — Addendum Note (Signed)
Addended by: Vennie Homans on: 04/28/2018 01:50 PM   Modules accepted: Orders

## 2018-04-28 NOTE — Patient Instructions (Addendum)
Medication Instructions:  STOP- Chlorthalidone  If you need a refill on your cardiac medications before your next appointment, please call your pharmacy.  Labwork: None Ordered   Testing/Procedures:  None Ordered  Follow-Up: Your physician wants you to follow-up in: As Needed.     Thank you for choosing [facility] at [facility]!!

## 2018-06-06 LAB — HM DIABETES EYE EXAM

## 2018-06-09 ENCOUNTER — Other Ambulatory Visit: Payer: Self-pay | Admitting: Family Medicine

## 2018-06-09 DIAGNOSIS — Z1231 Encounter for screening mammogram for malignant neoplasm of breast: Secondary | ICD-10-CM

## 2018-06-24 NOTE — Telephone Encounter (Signed)
Error

## 2018-06-28 ENCOUNTER — Encounter: Payer: Self-pay | Admitting: Family Medicine

## 2018-07-13 ENCOUNTER — Ambulatory Visit
Admission: RE | Admit: 2018-07-13 | Discharge: 2018-07-13 | Disposition: A | Discharge status: Home / Self Care | Source: Ambulatory Visit | Attending: Family Medicine | Admitting: Family Medicine

## 2018-07-13 DIAGNOSIS — Z1231 Encounter for screening mammogram for malignant neoplasm of breast: Secondary | ICD-10-CM

## 2018-07-21 NOTE — Progress Notes (Signed)
Chief Complaint  Patient presents with  . Hypertension    3 month f/u and review medications    HPI Hypertension She reports that she had a porkchop sandwich as a treat last night She states that she has been able to bring down the blood pressure with diet  She states that she was exercising but stopped  She states that she hopes to be off all medications She reports that she has not chest pains She is no longer on bp meds She does not want to go on bp meds because she thinks her pressure is related to the pork chop and all the salt BP Readings from Last 3 Encounters:  07/22/18 (!) 141/93  04/28/18 118/72  04/20/18 120/82    Diabetes Mellitus: Patient presents for follow up of diabetes. Symptoms: none. Symptoms have stabilized. Patient denies foot ulcerations, hyperglycemia, hypoglycemia , increase appetite, nausea, paresthesia of the feet, polydipsia, polyuria, visual disturbances, vomitting and weight loss.  Evaluation to date has been included: hemoglobin A1C.  Home sugars: patient does not check sugars. Treatment to date: Janumet.  Lab Results  Component Value Date   HGBA1C 6.4 (A) 07/22/2018   Dyslipidemia: Patient presents for evaluation of lipids.  PATIENT REFUSES ANY STATINS.  A repeat fasting lipid profile was done.  The patient does not use medications that may worsen dyslipidemias (corticosteroids, progestins, anabolic steroids, diuretics, beta-blockers, amiodarone, cyclosporine, olanzapine). The patient exercises intermittently.  The patient is not known to have coexisting coronary artery disease.   Lab Results  Component Value Date   CHOL 238 (H) 01/28/2018   CHOL 259 (H) 09/18/2017   CHOL 252 (H) 04/14/2017   Lab Results  Component Value Date   HDL 55 01/28/2018   HDL 53 09/18/2017   HDL 51 04/14/2017   Lab Results  Component Value Date   LDLCALC 169 (H) 01/28/2018   LDLCALC 188 (H) 09/18/2017   LDLCALC 185 (H) 04/14/2017   Lab Results  Component Value  Date   TRIG 71 01/28/2018   TRIG 91 09/18/2017   TRIG 81 04/14/2017   Lab Results  Component Value Date   CHOLHDL 4.3 01/28/2018   CHOLHDL 4.9 (H) 09/18/2017   CHOLHDL 4.9 (H) 04/14/2017   Lab Results  Component Value Date   LDLDIRECT 153.3 08/29/2012   LDLDIRECT 143.9 08/10/2011    The 10-year ASCVD risk score Mikey Bussing DC Jr., et al., 2013) is: 25.5%   Values used to calculate the score:     Age: 62 years     Sex: Female     Is Non-Hispanic African American: Yes     Diabetic: Yes     Tobacco smoker: No     Systolic Blood Pressure: 161 mmHg     Is BP treated: Yes     HDL Cholesterol: 55 mg/dL     Total Cholesterol: 238 mg/dL    Past Medical History:  Diagnosis Date  . Anemia   . Back pain   . GERD (gastroesophageal reflux disease)   . Hyperlipidemia   . Hypertension   . Overweight (BMI 25.0-29.9)   . Type 2 diabetes mellitus ([place])     Current Outpatient Medications  Medication Sig Dispense Refill  . aspirin EC 81 MG tablet Take 81 mg by mouth daily.    . sitaGLIPtin-metformin (JANUMET) 50-1000 MG tablet Take 1 tablet by mouth 2 (two) times daily with a meal. 180 tablet 1  . metFORMIN (GLUCOPHAGE) 500 MG tablet Take 1 tablet (500 mg total) by  mouth daily with breakfast. 90 tablet 1  . Red Yeast Rice 600 MG TABS Take 1 tablet (600 mg total) by mouth daily. 30 tablet 11   No current facility-administered medications for this visit.     Allergies:  Allergies  Allergen Reactions  . Fexofenadine Hcl   . Influenza Vaccines   . Lisinopril Cough  . Statins Other (See Comments)    Pt refuses    Past Surgical History:  Procedure Laterality Date  . BREAST BIOPSY Right     Social History   Socioeconomic History  . Marital status: Married    Spouse name: Mortimer Fries  . Number of children: 1  . Years of education: Not on file  . Highest education level: Not on file  Occupational History  . Occupation: housecleaning    Comment: once weekly  Social Needs  .  Financial resource strain: Not on file  . Food insecurity:    Worry: Not on file    Inability: Not on file  . Transportation needs:    Medical: Not on file    Non-medical: Not on file  Tobacco Use  . Smoking status: Never Smoker  . Smokeless tobacco: Never Used  Substance and Sexual Activity  . Alcohol use: Yes    Alcohol/week: 2.0 standard drinks    Types: 2 Standard drinks or equivalent per week    Comment: Socially - 1x/mo  . Drug use: No  . Sexual activity: Not Currently    Birth control/protection: Post-menopausal  Lifestyle  . Physical activity:    Days per week: Not on file    Minutes per session: Not on file  . Stress: Not on file  Relationships  . Social connections:    Talks on phone: Not on file    Gets together: Not on file    Attends religious service: Not on file    Active member of club or organization: Not on file    Attends meetings of clubs or organizations: Not on file    Relationship status: Not on file  Other Topics Concern  . Not on file  Social History Narrative   Marital status: widowed after married x 8 years; second marriage      Children:  1 son (11); 2 grandchildren (18, 16 twins); no gg      Lives: alone      Employment: unemployed in 2019.       Tobacco;  None      Alcohol: holidays; limits alcohol due to father with alcoholism       Exercise:  None       Seatbelt:  100%; texting none       Family History  Problem Relation Age of Onset  . Kidney failure Mother   . Diabetes Mother   . Alcohol abuse Father   . Liver disease Father   . Lupus Sister   . Mental illness Sister        Bipolar disorder     ROS Review of Systems See HPI Constitution: No fevers or chills No malaise No diaphoresis Skin: No rash or itching Eyes: no blurry vision, no double vision GU: no dysuria or hematuria Neuro: no dizziness or headaches all others reviewed and negative   Objective: Vitals:   07/22/18 0850  BP: (!) 141/93  Pulse: 83  Resp:  17  Temp: 98.5 F (36.9 C)  TempSrc: Oral  SpO2: 97%  Weight: 177 lb 9.6 oz (80.6 kg)  Height: 5\' 6"  (1.676 m)  Physical Exam  Constitutional: She is oriented to person, place, and time. She appears well-developed and well-nourished.  HENT:  Head: Normocephalic and atraumatic.  Eyes: Conjunctivae and EOM are normal.  Cardiovascular: Normal rate, regular rhythm and normal heart sounds.  No murmur heard. Pulmonary/Chest: Effort normal and breath sounds normal. No stridor. No respiratory distress. She has no wheezes.  Neurological: She is alert and oriented to person, place, and time.  Skin: Skin is warm. Capillary refill takes less than 2 seconds.  Psychiatric: She has a normal mood and affect. Her behavior is normal. Judgment and thought content normal.     Assessment and Plan Nap[place]ali was seen today for hypertension.  Diagnoses and all orders for this visit:  Essential hypertension- will reheck at next visit  Continue DASH diet Will check renal function  Will have pt return for bp check in January 2020 -     Lipid panel -     Comprehensive metabolic panel  Type 2 diabetes mellitus without complication, without long-term current use of insulin ([place])- diabetes well controlled Will change Janumet BID to metformin 500mg  with breakfast Pt was interested in stopping all diabetes medications but given her untreated dyslipidemia we compromised on continuing good diabetes control  -     POCT glycosylated hemoglobin (Hb A1C) -     Lipid panel -     Comprehensive metabolic panel  Dyslipidemia-  Reviewed ASCVD risk score Pt declined statin She is willing to take red yeast rice and to continue her diabetes med with aspirin -     Lipid panel -     Comprehensive metabolic panel -     Red Yeast Rice 600 MG TABS; Take 1 tablet (600 mg total) by mouth daily.  Refusal of statin medication by patient -     Red Yeast Rice 600 MG TABS; Take 1 tablet (600 mg total) by mouth daily.  Other  orders -     metFORMIN (GLUCOPHAGE) 500 MG tablet; Take 1 tablet (500 mg total) by mouth daily with breakfast.     Janson Lamar A Nolon Rod

## 2018-07-22 ENCOUNTER — Encounter: Payer: Self-pay | Admitting: Family Medicine

## 2018-07-22 ENCOUNTER — Ambulatory Visit (INDEPENDENT_AMBULATORY_CARE_PROVIDER_SITE_OTHER): Admitting: Family Medicine

## 2018-07-22 ENCOUNTER — Other Ambulatory Visit: Payer: Self-pay

## 2018-07-22 VITALS — BP 141/93 | HR 83 | Temp 98.5°F | Resp 17 | Ht 66.0 in | Wt 177.6 lb

## 2018-07-22 DIAGNOSIS — Z5329 Procedure and treatment not carried out because of patient's decision for other reasons: Secondary | ICD-10-CM | POA: Diagnosis not present

## 2018-07-22 DIAGNOSIS — I10 Essential (primary) hypertension: Secondary | ICD-10-CM | POA: Diagnosis not present

## 2018-07-22 DIAGNOSIS — Z5320 Procedure and treatment not carried out because of patient's decision for unspecified reasons: Secondary | ICD-10-CM

## 2018-07-22 DIAGNOSIS — E119 Type 2 diabetes mellitus without complications: Secondary | ICD-10-CM | POA: Diagnosis not present

## 2018-07-22 DIAGNOSIS — E785 Hyperlipidemia, unspecified: Secondary | ICD-10-CM | POA: Diagnosis not present

## 2018-07-22 LAB — LIPID PANEL
CHOLESTEROL TOTAL: 219 mg/dL — AB (ref 100–199)
Chol/HDL Ratio: 3.7 ratio (ref 0.0–4.4)
HDL: 60 mg/dL (ref >39)
LDL CALC: 143 mg/dL — AB (ref 0–99)
Triglycerides: 80 mg/dL (ref 0–149)
VLDL CHOLESTEROL CAL: 16 mg/dL (ref 5–40)

## 2018-07-22 LAB — COMPREHENSIVE METABOLIC PANEL
A/G RATIO: 1.8 (ref 1.2–2.2)
ALK PHOS: 61 IU/L (ref 39–117)
ALT: 10 IU/L (ref 0–32)
AST: 14 IU/L (ref 0–40)
Albumin: 4.5 g/dL (ref 3.6–4.8)
BILIRUBIN TOTAL: 0.3 mg/dL (ref 0.0–1.2)
BUN / CREAT RATIO: 12 (ref 12–28)
BUN: 12 mg/dL (ref 8–27)
CHLORIDE: 103 mmol/L (ref 96–106)
CO2: 22 mmol/L (ref 20–29)
Calcium: 10.7 mg/dL — ABNORMAL HIGH (ref 8.7–10.3)
Creatinine, Ser: 1.01 mg/dL — ABNORMAL HIGH (ref 0.57–1.00)
GFR calc Af Amer: 69 mL/min/{1.73_m2} (ref >59)
GFR calc non Af Amer: 60 mL/min/{1.73_m2} (ref >59)
GLOBULIN, TOTAL: 2.5 g/dL (ref 1.5–4.5)
Glucose: 134 mg/dL — ABNORMAL HIGH (ref 65–99)
POTASSIUM: 5.2 mmol/L (ref 3.5–5.2)
SODIUM: 141 mmol/L (ref 134–144)
Total Protein: 7 g/dL (ref 6.0–8.5)

## 2018-07-22 LAB — POCT GLYCOSYLATED HEMOGLOBIN (HGB A1C): Hemoglobin A1C: 6.4 % — AB (ref 4.0–5.6)

## 2018-07-22 MED ORDER — METFORMIN HCL 500 MG PO TABS: 500.0000 mg | ORAL_TABLET | Freq: Every day | ORAL | 1 refills | Status: DC

## 2018-07-22 MED ORDER — RED YEAST RICE 600 MG PO TABS: 1.0000 | ORAL_TABLET | Freq: Every day | ORAL | 11 refills | Status: DC

## 2018-07-22 NOTE — Patient Instructions (Addendum)
  You can stop  The JanuMET  You only need the metformin So [facility]   Continue your current healthy eating Continue exercise  Pick up garlique and red yeast rice for your cholesterol    If you have lab work done today you will be contacted with your lab results within the next 2 weeks.  If you have not heard from [place] then please contact us. The fastest way to get your results is to register for My Chart.   IF you received an x-ray today, you will receive an invoice from [facility] Radiology. Please contact [facility] Radiology at [phone] with questions or concerns regarding your invoice.   IF you received labwork today, you will receive an invoice from [place]. Please contact [facility] at [phone] with questions or concerns regarding your invoice.   Our billing staff will not be able to assist you with questions regarding bills from these companies.  You will be contacted with the lab results as soon as they are available. The fastest way to get your results is to activate your My Chart account. Instructions are located on the last page of this paperwork. If you have not heard from [place] regarding the results in 2 weeks, please contact this office.

## 2018-10-26 ENCOUNTER — Other Ambulatory Visit: Payer: Self-pay

## 2018-10-26 ENCOUNTER — Encounter: Payer: Self-pay | Admitting: Family Medicine

## 2018-10-26 ENCOUNTER — Ambulatory Visit (INDEPENDENT_AMBULATORY_CARE_PROVIDER_SITE_OTHER): Admitting: Family Medicine

## 2018-10-26 ENCOUNTER — Ambulatory Visit (HOSPITAL_COMMUNITY)
Admission: RE | Admit: 2018-10-26 | Discharge: 2018-10-26 | Disposition: A | Discharge status: Home / Self Care | Source: Ambulatory Visit | Attending: Family Medicine | Admitting: Family Medicine

## 2018-10-26 VITALS — BP 148/98 | HR 93 | Temp 98.9°F | Resp 14 | Ht 66.0 in | Wt 179.8 lb

## 2018-10-26 DIAGNOSIS — R0789 Other chest pain: Secondary | ICD-10-CM | POA: Diagnosis not present

## 2018-10-26 DIAGNOSIS — E119 Type 2 diabetes mellitus without complications: Secondary | ICD-10-CM | POA: Diagnosis not present

## 2018-10-26 DIAGNOSIS — G441 Vascular headache, not elsewhere classified: Secondary | ICD-10-CM

## 2018-10-26 DIAGNOSIS — E785 Hyperlipidemia, unspecified: Secondary | ICD-10-CM | POA: Diagnosis not present

## 2018-10-26 DIAGNOSIS — I10 Essential (primary) hypertension: Secondary | ICD-10-CM

## 2018-10-26 DIAGNOSIS — G44209 Tension-type headache, unspecified, not intractable: Secondary | ICD-10-CM

## 2018-10-26 DIAGNOSIS — Z789 Other specified health status: Secondary | ICD-10-CM

## 2018-10-26 LAB — POCT GLYCOSYLATED HEMOGLOBIN (HGB A1C): HEMOGLOBIN A1C: 7.7 % — AB (ref 4.0–5.6)

## 2018-10-26 LAB — GLUCOSE, POCT (MANUAL RESULT ENTRY): POC Glucose: 169 mg/dl — AB (ref 70–99)

## 2018-10-26 MED ORDER — GADOBUTROL 1 MMOL/ML IV SOLN
8.0000 mL | Freq: Once | INTRAVENOUS | Status: AC | PRN
  Administered 2018-10-26: 8 mL via INTRAVENOUS

## 2018-10-26 MED ORDER — VERAPAMIL HCL ER 120 MG PO TBCR: 120.0000 mg | EXTENDED_RELEASE_TABLET | Freq: Every day | ORAL | 0 refills | Status: DC

## 2018-10-26 MED ORDER — LOSARTAN POTASSIUM 50 MG PO TABS: 50.0000 mg | ORAL_TABLET | Freq: Every day | ORAL | 0 refills | Status: DC

## 2018-10-26 NOTE — Patient Instructions (Addendum)
MRI stat-  We will notify you of the results today  Follow up in 3 weeks for blood pressure recheck and to discuss MRI and headaches  Take verapamil at bedtime for headache and blood pressure daily Take losartan in the morning for blood pressure  Increase metformin to twice a day for diabetes Cut back on starchy and salty foods and fried foods  Consume mostly moderate to low glycemic index foods. See chart below.   If you have lab work done today you will be contacted with your lab results within the next 2 weeks.  If you have not heard from [place] then please contact us. The fastest way to get your results is to register for My Chart.   IF you received an x-ray today, you will receive an invoice from [facility]. Please contact [facility] at [phone] with questions or concerns regarding your invoice.   IF you received labwork today, you will receive an invoice from [facility]. Please contact [facility] at [phone] with questions or concerns regarding your invoice.   Our billing staff will not be able to assist you with questions regarding bills from these companies.  You will be contacted with the lab results as soon as they are available. The fastest way to get your results is to activate your My Chart account. Instructions are located on the last page of this paperwork. If you have not heard from [place] regarding the results in 2 weeks, please contact this office.     Low Glycemic Foods (20-49)  Breakfast Cereals: All-Bran                All-Bran Fruit ' n Oats Fiber One               Oatmeal (not instant)  Oat bran Fruits and fruit juices: (Limit to 1-2 servings per day) Apples               Apricots (fresh & dried)  Blackberries            Blueberries Cherries                  Cranberries             Peaches                  Pears                       Plums                       Prunes Grapefruit                Raspberries            Strawberries            Tangerine Apple juice             Grapefruit juice Tomato juice  Beans and legumes (fresh-cooked): Black-eyed peas     Butter beans Chick peas              Lentils     Green beans           Lima beans               Kidney beans          Navy beans  Non-starchy vegetables: Asparagus, bok choy, broccoli, cabbage, cauliflower, celery, cucumber, greens, lettuce, mushrooms, peppers, tomatoes, okra, onions, snow peas,  spinach, summer squash  Grains: Barley                                Bulgur Rye                                    Wild rice  Nuts and oils : Almonds         Peanuts     Sunflower seeds  Hazelnuts      Pecans          Walnuts Oils that are liquid at room temperature  Dairy, fish, and meat: Milk, skim                         Lowfat cheese Yogurt, lowfat, fruit sugar sweetened Lean red meat                      Fish  Skinless chicken & [place]    Shellfish Moderate Glycemic Foods (50-69)  Breakfast Cereals: Bran Buds                             Bran Chex Just Right                            Mini-Wheats Special K         Swiss muesli  Fruits: Banana (under-ripe)             Dates Figs                                      Grapes Kiwi                                      Mango Oranges                               Raisins  Fruit Juices: Cranberry juice                    Orange juice  Beans and legumes: [place]-type baked beans Canned pinto, kidney, or navy beans Green peas  Vegetables: Beets                         Raw Carrots  Sweet potato              Yam Corn on the cob  Breads: Pita (pocket) bread          Oat bran bread Pumpernickel bread           Rye bread Wheat bread, high fiber       Grains: Cornmeal                           Rice, brown   Rice, white                         Couscous Pasta: Macaroni  Pizza  cheese Raviolimeat filled           Spaghetti, white        Nuts: Cashews                            Macadamia  Snacks: Chocolate                    Ice cream,lowfat  Muffin                               Popcorn High Glycemic Foods (70-100)   Breakfast Cereals: Cheerios                 Corn Chex Corn Flakes            Cream of Wheat Grape Nuts              Grape Nut Flakes Life                 Nutri-Grain       Puffed Rice               Puffed Wheat Rice Chex                 Rice Krispies Shredded Wheat             Team Total Fruits: Pineapple                 Watermelon Banana (over-ripe)  Beverages: Sodas, sweet tea, pineapple juice  Vegetables: Potato, baked, boiled, fried, mashed [place] fries Canned or frozen corn Cooked carrots Parsnips Winter squash  Breads: Most breads (white and whole grain) Bagels                     Bread sticks Bread stuffing          Kaiser roll Dinner rolls  Grains: Rice, instant          Tapioca, with milk  Candy and most cookies Snacks: Donuts                      Corn chips        Jelly beans                 Pretzels Pastries                             Restaurant and ethnic foods Most [place] food (sugar in stir fry or wok sauces) Teriyaki-style meats and vegetables

## 2018-10-26 NOTE — Progress Notes (Signed)
Established Patient Office Visit  Subjective:  Patient ID: Alexis Shields, female    DOB: July 17, 1956  Age: 63 y.o. MRN: [id number]  CC:  Chief Complaint  Patient presents with  . Diabetes    was last seen here 07/22/18 for diabetes, told to pick up red yeast rice and to stop the janumet. Have not been able to find the red yeast rice  in any store. Blood pressure has been extremely high, have not been able to sleep with bp being up so high. Bp in office today was high 159/106 will recheck before check out    HPI Alexis Shields presents for:  Patient with severe headache associated 182/102 three days ago, with blurry vision, and chest pressure 3 days ago with a pounding sensation like a hammer hitting her in the back of her head and roaring sound in her ears and running around her head.   She reports that she has had family drama with a sister in the hospital She states that she started having headaches about 2 weeks ago She states that she kept waiting for her appointment Her head hurts even when her scalp touches the pillow  She states that she tried epsom salts to drink it and took vinegar  She reports that her sister is in and out of the hospital She states that she feels like she was doing so much better and now her sister has run up her bp. Her reading on Sunday was 182/102 and she did not get medical attention.  She reports that she had some chest pains last night and had some blurry vision on Sunday.  She reports that she sat in a dark room and rested and that helped it. She did not want to go to the hospital due to bills with 182/102.  BP Readings from Last 3 Encounters:  10/26/18 (!) 148/98  07/22/18 (!) 141/93  04/28/18 118/72     Diabetes Mellitus: Patient presents for follow up of diabetes.  She reports that she has not been sticking to a diabetic diet and is not taking the Janumet. Patient denies foot ulcerations, hypoglycemia  and nausea.  Evaluation to date  has been included: hemoglobin A1C.  Home sugars: patient does not check sugars.  She reports that she is taking her metformin '500mg'$  once daily with breakfast. Lab Results  Component Value Date   HGBA1C 7.7 (A) 10/26/2018   Lab Results  Component Value Date   HGBA1C 7.7 (A) 10/26/2018    Dyslipidemia: Patient presents for evaluation of lipids. She was not able to find the red yeast rice and cannot tolerate the statin medication.  A repeat fasting lipid profile was done.  The patient does not use medications that may worsen dyslipidemias (corticosteroids, progestins, anabolic steroids, diuretics, beta-blockers, amiodarone, cyclosporine, olanzapine). The patient exercises never.  The patient is not known to have coexisting coronary artery disease.   The 10-year ASCVD risk score Mikey Bussing DC Brooke Bonito., et al., 2013) is: 25.9%   Values used to calculate the score:     Age: 46 years     Sex: Female     Is Non-Hispanic African American: Yes     Diabetic: Yes     Tobacco smoker: No     Systolic Blood Pressure: 277 mmHg     Is BP treated: Yes     HDL Cholesterol: 60 mg/dL     Total Cholesterol: 219 mg/dL  Lab Results  Component Value Date   CHOL 219 (  H) 07/22/2018   CHOL 238 (H) 01/28/2018   CHOL 259 (H) 09/18/2017   Lab Results  Component Value Date   HDL 60 07/22/2018   HDL 55 01/28/2018   HDL 53 09/18/2017   Lab Results  Component Value Date   LDLCALC 143 (H) 07/22/2018   LDLCALC 169 (H) 01/28/2018   LDLCALC 188 (H) 09/18/2017   Lab Results  Component Value Date   TRIG 80 07/22/2018   TRIG 71 01/28/2018   TRIG 91 09/18/2017   Lab Results  Component Value Date   CHOLHDL 3.7 07/22/2018   CHOLHDL 4.3 01/28/2018   CHOLHDL 4.9 (H) 09/18/2017   Lab Results  Component Value Date   LDLDIRECT 153.3 08/29/2012   LDLDIRECT 143.9 08/10/2011      Past Medical History:  Diagnosis Date  . Anemia   . Back pain   . GERD (gastroesophageal reflux disease)   . Hyperlipidemia   .  Hypertension   . Overweight (BMI 25.0-29.9)   . Type 2 diabetes mellitus ([place])     Past Surgical History:  Procedure Laterality Date  . BREAST BIOPSY Right     Family History  Problem Relation Age of Onset  . Kidney failure Mother   . Diabetes Mother   . Alcohol abuse Father   . Liver disease Father   . Lupus Sister   . Mental illness Sister        Bipolar disorder    Social History   Socioeconomic History  . Marital status: Married    Spouse name: Mortimer Fries  . Number of children: 1  . Years of education: Not on file  . Highest education level: Not on file  Occupational History  . Occupation: housecleaning    Comment: once weekly  Social Needs  . Financial resource strain: Not on file  . Food insecurity:    Worry: Not on file    Inability: Not on file  . Transportation needs:    Medical: Not on file    Non-medical: Not on file  Tobacco Use  . Smoking status: Never Smoker  . Smokeless tobacco: Never Used  Substance and Sexual Activity  . Alcohol use: Yes    Alcohol/week: 2.0 standard drinks    Types: 2 Standard drinks or equivalent per week    Comment: Socially - 1x/mo  . Drug use: No  . Sexual activity: Not Currently    Birth control/protection: Post-menopausal  Lifestyle  . Physical activity:    Days per week: Not on file    Minutes per session: Not on file  . Stress: Not on file  Relationships  . Social connections:    Talks on phone: Not on file    Gets together: Not on file    Attends religious service: Not on file    Active member of club or organization: Not on file    Attends meetings of clubs or organizations: Not on file    Relationship status: Not on file  . Intimate partner violence:    Fear of current or ex partner: Not on file    Emotionally abused: Not on file    Physically abused: Not on file    Forced sexual activity: Not on file  Other Topics Concern  . Not on file  Social History Narrative   Marital status: widowed after married x  8 years; second marriage      Children:  1 son (29); 2 grandchildren (4, 17 twins); no gg  Lives: alone      Employment: unemployed in 2019.       Tobacco;  None      Alcohol: holidays; limits alcohol due to father with alcoholism       Exercise:  None       Seatbelt:  100%; texting none       Outpatient Medications Prior to Visit  Medication Sig Dispense Refill  . aspirin EC 81 MG tablet Take 81 mg by mouth daily.    . metFORMIN (GLUCOPHAGE) 500 MG tablet Take 1 tablet (500 mg total) by mouth daily with breakfast. 90 tablet 1  . Red Yeast Rice 600 MG TABS Take 1 tablet (600 mg total) by mouth daily. (Patient not taking: Reported on 10/26/2018) 30 tablet 11  . sitaGLIPtin-metformin (JANUMET) 50-1000 MG tablet Take 1 tablet by mouth 2 (two) times daily with a meal. (Patient not taking: Reported on 10/26/2018) 180 tablet 1   No facility-administered medications prior to visit.     Allergies  Allergen Reactions  . Fexofenadine Hcl   . Influenza Vaccines   . Lisinopril Cough  . Statins Other (See Comments)    Pt refuses    ROS Review of Systems    Objective:    Physical Exam  BP (!) 148/98 (BP Location: Left Arm, Patient Position: Sitting, Cuff Size: Large)   Pulse 93   Temp 98.9 F (37.2 C) (Oral)   Resp 14   Ht '5\' 6"'$  (1.676 m)   Wt 179 lb 12.8 oz (81.6 kg)   SpO2 96%   BMI 29.02 kg/m  Wt Readings from Last 3 Encounters:  10/26/18 179 lb 12.8 oz (81.6 kg)  07/22/18 177 lb 9.6 oz (80.6 kg)  04/28/18 168 lb 3.2 oz (76.3 kg)   Physical Exam  Constitutional: Oriented to person, place, and time. Appears well-developed and well-nourished.  HENT:  Head: Normocephalic and atraumatic.  Eyes: Conjunctivae and EOM are normal. Fundoscopic exam without papilledema Cardiovascular: Normal rate, regular rhythm, normal heart sounds and intact distal pulses.  No murmur heard. Pulmonary/Chest: Effort normal and breath sounds normal. No stridor. No respiratory distress. Has no  wheezes.  Neurological: Is alert and oriented to person, place, and time. Normal CN exam. Normal motor strength Skin: Skin is warm. Capillary refill takes less than 2 seconds.  Psychiatric: Has a normal mood and affect. Behavior is normal. Judgment and thought content normal.     ECG:  Sinus rhythm, no twi, no st elevation There are no preventive care reminders to display for this patient.  There are no preventive care reminders to display for this patient.  Lab Results  Component Value Date   TSH 1.310 01/28/2018   Lab Results  Component Value Date   WBC 5.4 01/28/2018   HGB 12.7 01/28/2018   HCT 37.9 01/28/2018   MCV 80 01/28/2018   PLT 330 01/28/2018   Lab Results  Component Value Date   NA 141 07/22/2018   K 5.2 07/22/2018   CO2 22 07/22/2018   GLUCOSE 134 (H) 07/22/2018   BUN 12 07/22/2018   CREATININE 1.01 (H) 07/22/2018   BILITOT 0.3 07/22/2018   ALKPHOS 61 07/22/2018   AST 14 07/22/2018   ALT 10 07/22/2018   PROT 7.0 07/22/2018   ALBUMIN 4.5 07/22/2018   CALCIUM 10.7 (H) 07/22/2018   GFR 94.77 02/19/2014   Lab Results  Component Value Date   CHOL 219 (H) 07/22/2018   Lab Results  Component Value Date   HDL  60 07/22/2018   Lab Results  Component Value Date   LDLCALC 143 (H) 07/22/2018   Lab Results  Component Value Date   TRIG 80 07/22/2018   Lab Results  Component Value Date   CHOLHDL 3.7 07/22/2018   Lab Results  Component Value Date   HGBA1C 7.7 (A) 10/26/2018      Assessment & Plan:   Problem List Items Addressed This Visit      Cardiovascular and Mediastinum   Hypertension - Primary     -   Patient's blood pressure is at goal of 139/89 or less. Condition is stable. Continue current medications and treatment plan. I recommend that you exercise for 30-45 minutes 5 days a week. I also recommend a balanced diet with fruits and vegetables every day, lean meats, and little fried foods. The DASH diet (you can find this online) is a good  example of this.    Relevant Medications   losartan (COZAAR) 50 MG tablet   verapamil (CALAN-SR) 120 MG CR tablet   Other Relevant Orders   Lipid Panel   CMP14+EGFR   EKG 12-Lead (Completed)     Endocrine   Type 2 diabetes mellitus (HCC)    -   Diabetes goals discussed Goal a1c <7%    Relevant Medications   losartan (COZAAR) 50 MG tablet   Other Relevant Orders   Lipid Panel   POCT glycosylated hemoglobin (Hb A1C) (Completed)   POCT glucose (manual entry) (Completed)   CMP14+EGFR   POCT glycosylated hemoglobin (Hb A1C)    Other Visit Diagnoses    Dyslipidemia    -  Discussed lipid management   Relevant Orders   Lipid Panel   CMP14+EGFR   EKG 12-Lead (Completed)   Chest pressure    -ECG was reassuring Restarted bp medications   Relevant Orders   EKG 12-Lead (Completed)   Tension vascular headache       Relevant Medications   losartan (COZAAR) 50 MG tablet   verapamil (CALAN-SR) 120 MG CR tablet   Other vascular headache    -due to the nature of the headache will send patient for MRI brain to evaluate for aneurysm or other risk factors for the headaches.   Relevant Medications   losartan (COZAAR) 50 MG tablet   verapamil (CALAN-SR) 120 MG CR tablet   Other Relevant Orders   MR Brain W Wo Contrast      Meds ordered this encounter  Medications  . losartan (COZAAR) 50 MG tablet    Sig: Take 1 tablet (50 mg total) by mouth daily.    Dispense:  30 tablet    Refill:  0  . verapamil (CALAN-SR) 120 MG CR tablet    Sig: Take 1 tablet (120 mg total) by mouth at bedtime.    Dispense:  30 tablet    Refill:  0    Follow-up: No follow-ups on file.    [name], MD

## 2018-10-27 LAB — LIPID PANEL
CHOLESTEROL TOTAL: 228 mg/dL — AB (ref 100–199)
Chol/HDL Ratio: 4.2 ratio (ref 0.0–4.4)
HDL: 54 mg/dL (ref >39)
LDL Calculated: 154 mg/dL — ABNORMAL HIGH (ref 0–99)
Triglycerides: 102 mg/dL (ref 0–149)
VLDL Cholesterol Cal: 20 mg/dL (ref 5–40)

## 2018-10-27 LAB — CMP14+EGFR
ALBUMIN: 4.4 g/dL (ref 3.8–4.8)
ALT: 10 IU/L (ref 0–32)
AST: 13 IU/L (ref 0–40)
Albumin/Globulin Ratio: 1.8 (ref 1.2–2.2)
Alkaline Phosphatase: 65 IU/L (ref 39–117)
BUN / CREAT RATIO: 10 — AB (ref 12–28)
BUN: 10 mg/dL (ref 8–27)
Bilirubin Total: 0.4 mg/dL (ref 0.0–1.2)
CO2: 21 mmol/L (ref 20–29)
CREATININE: 0.98 mg/dL (ref 0.57–1.00)
Calcium: 10.2 mg/dL (ref 8.7–10.3)
Chloride: 103 mmol/L (ref 96–106)
GFR, EST AFRICAN AMERICAN: 71 mL/min/{1.73_m2} (ref >59)
GFR, EST NON AFRICAN AMERICAN: 62 mL/min/{1.73_m2} (ref >59)
GLUCOSE: 193 mg/dL — AB (ref 65–99)
Globulin, Total: 2.5 g/dL (ref 1.5–4.5)
Potassium: 4.9 mmol/L (ref 3.5–5.2)
Sodium: 144 mmol/L (ref 134–144)
TOTAL PROTEIN: 6.9 g/dL (ref 6.0–8.5)

## 2018-10-27 LAB — POCT I-STAT CREATININE: Creatinine, Ser: 0.9 mg/dL (ref 0.44–1.00)

## 2018-10-27 NOTE — Addendum Note (Signed)
Addended by: Delia Chimes A on: 10/27/2018 08:46 AM   Modules accepted: Orders

## 2018-11-10 ENCOUNTER — Ambulatory Visit: Admitting: Internal Medicine

## 2018-11-18 ENCOUNTER — Ambulatory Visit (INDEPENDENT_AMBULATORY_CARE_PROVIDER_SITE_OTHER): Admitting: Family Medicine

## 2018-11-18 ENCOUNTER — Encounter: Payer: Self-pay | Admitting: Family Medicine

## 2018-11-18 ENCOUNTER — Other Ambulatory Visit: Payer: Self-pay

## 2018-11-18 VITALS — BP 146/84 | HR 87 | Temp 98.6°F | Resp 17 | Ht 66.0 in | Wt 181.0 lb

## 2018-11-18 DIAGNOSIS — E785 Hyperlipidemia, unspecified: Secondary | ICD-10-CM | POA: Diagnosis not present

## 2018-11-18 DIAGNOSIS — I10 Essential (primary) hypertension: Secondary | ICD-10-CM

## 2018-11-18 DIAGNOSIS — R3589 Other polyuria: Secondary | ICD-10-CM

## 2018-11-18 DIAGNOSIS — R358 Other polyuria: Secondary | ICD-10-CM

## 2018-11-18 DIAGNOSIS — E1142 Type 2 diabetes mellitus with diabetic polyneuropathy: Secondary | ICD-10-CM

## 2018-11-18 LAB — POCT URINALYSIS DIP (MANUAL ENTRY)
Bilirubin, UA: NEGATIVE
Blood, UA: NEGATIVE
Glucose, UA: NEGATIVE mg/dL
Ketones, POC UA: NEGATIVE mg/dL
Leukocytes, UA: NEGATIVE
Nitrite, UA: NEGATIVE
Spec Grav, UA: 1.025 (ref 1.010–1.025)
UROBILINOGEN UA: 0.2 U/dL
pH, UA: 6.5 (ref 5.0–8.0)

## 2018-11-18 NOTE — Progress Notes (Signed)
Established Patient Office Visit  Subjective:  Patient ID: Alexis Shields, female    DOB: 28-Feb-1956  Age: 63 y.o. MRN: [id number]  CC:  Chief Complaint  Patient presents with  . Hypertension  . Headache    HPI Alexis Shields presents for   Hypertension She reports that she has a headache and hypertension She states that she has lots of stress right now She is now taking losartan and verapamil She states that her headaches have resolved on the verapamil  She states that since she stopped the salt she has been having increased urination.  BP Readings from Last 3 Encounters:  11/18/18 (!) 146/84  10/26/18 (!) 148/98  07/22/18 (!) 141/93    Muscle Cramps  She states that she gets cramps in her legs since decreasing her sodium She states that she stopped the salted foods, and canned foods The low sodium helped her headaches BP Readings from Last 3 Encounters:  11/18/18 (!) 146/84  10/26/18 (!) 148/98  07/22/18 (!) 141/93    Diabetes Mellitus: Patient presents for follow up of diabetes. Symptoms: polyuria. Symptoms have stabilized. Patient denies foot ulcerations, hyperglycemia, hypoglycemia , increase appetite, nausea and paresthesia of the feet.  Evaluation to date has been included: hemoglobin A1C.  Home sugars: patient does not check sugars. Treatment to date: Continued metformin which has been effective. She wakes up at 3am and 5am. No dysuria. She states that since she stopped the salt she has been having increased urination.  Lab Results  Component Value Date   HGBA1C 7.7 (A) 10/26/2018      Past Medical History:  Diagnosis Date  . Anemia   . Back pain   . GERD (gastroesophageal reflux disease)   . Hyperlipidemia   . Hypertension   . Overweight (BMI 25.0-29.9)   . Type 2 diabetes mellitus ([place])     Past Surgical History:  Procedure Laterality Date  . BREAST BIOPSY Right     Family History  Problem Relation Age of Onset  . Kidney failure  Mother   . Diabetes Mother   . Alcohol abuse Father   . Liver disease Father   . Lupus Sister   . Mental illness Sister        Bipolar disorder    Social History   Socioeconomic History  . Marital status: Married    Spouse name: Mortimer Fries  . Number of children: 1  . Years of education: Not on file  . Highest education level: Not on file  Occupational History  . Occupation: housecleaning    Comment: once weekly  Social Needs  . Financial resource strain: Not on file  . Food insecurity:    Worry: Not on file    Inability: Not on file  . Transportation needs:    Medical: Not on file    Non-medical: Not on file  Tobacco Use  . Smoking status: Never Smoker  . Smokeless tobacco: Never Used  Substance and Sexual Activity  . Alcohol use: Yes    Alcohol/week: 2.0 standard drinks    Types: 2 Standard drinks or equivalent per week    Comment: Socially - 1x/mo  . Drug use: No  . Sexual activity: Not Currently    Birth control/protection: Post-menopausal  Lifestyle  . Physical activity:    Days per week: Not on file    Minutes per session: Not on file  . Stress: Not on file  Relationships  . Social connections:    Talks on phone:  Not on file    Gets together: Not on file    Attends religious service: Not on file    Active member of club or organization: Not on file    Attends meetings of clubs or organizations: Not on file    Relationship status: Not on file  . Intimate partner violence:    Fear of current or ex partner: Not on file    Emotionally abused: Not on file    Physically abused: Not on file    Forced sexual activity: Not on file  Other Topics Concern  . Not on file  Social History Narrative   Marital status: widowed after married x 8 years; second marriage      Children:  1 son (55); 2 grandchildren (18, 69 twins); no gg      Lives: alone      Employment: unemployed in 2019.       Tobacco;  None      Alcohol: holidays; limits alcohol due to father with  alcoholism       Exercise:  None       Seatbelt:  100%; texting none       Outpatient Medications Prior to Visit  Medication Sig Dispense Refill  . aspirin EC 81 MG tablet Take 81 mg by mouth daily.    Marland Kitchen losartan (COZAAR) 50 MG tablet Take 1 tablet (50 mg total) by mouth daily. 30 tablet 0  . verapamil (CALAN-SR) 120 MG CR tablet Take 1 tablet (120 mg total) by mouth at bedtime. 30 tablet 0   No facility-administered medications prior to visit.     Allergies  Allergen Reactions  . Fexofenadine Hcl   . Influenza Vaccines   . Lisinopril Cough  . Statins Other (See Comments)    Pt refuses    ROS Review of Systems Review of Systems  Constitutional: Negative for activity change, appetite change, chills and fever.  HENT: Negative for congestion, nosebleeds, trouble swallowing and voice change.   Respiratory: Negative for cough, shortness of breath and wheezing.   Gastrointestinal: Negative for diarrhea, nausea and vomiting.  Genitourinary: see hpi Musculoskeletal: Negative for back pain, joint swelling and neck pain.  Neurological: Negative for dizziness, speech difficulty, light-headedness and numbness.  See HPI. All other review of systems negative.     Objective:    Physical Exam  BP (!) 146/84 (BP Location: Right Arm, Patient Position: Sitting, Cuff Size: Normal)   Pulse 87   Temp 98.6 F (37 C) (Oral)   Resp 17   Ht 5' 6" (1.676 m)   Wt 181 lb (82.1 kg)   SpO2 99%   BMI 29.21 kg/m  Wt Readings from Last 3 Encounters:  11/18/18 181 lb (82.1 kg)  10/26/18 179 lb 12.8 oz (81.6 kg)  07/22/18 177 lb 9.6 oz (80.6 kg)   Physical Exam  Constitutional: Oriented to person, place, and time. Appears well-developed and well-nourished.  HENT:  Head: Normocephalic and atraumatic.  Eyes: Conjunctivae and EOM are normal.  Cardiovascular: Normal rate, regular rhythm, normal heart sounds and intact distal pulses.  No murmur heard. Pulmonary/Chest: Effort normal and breath  sounds normal. No stridor. No respiratory distress. Has no wheezes.  Neurological: Is alert and oriented to person, place, and time.  Skin: Skin is warm. Capillary refill takes less than 2 seconds.  Psychiatric: Has a normal mood and affect. Behavior is normal. Judgment and thought content normal.    There are no preventive care reminders to display for this  patient.  There are no preventive care reminders to display for this patient.  Lab Results  Component Value Date   TSH 1.310 01/28/2018   Lab Results  Component Value Date   WBC 5.4 01/28/2018   HGB 12.7 01/28/2018   HCT 37.9 01/28/2018   MCV 80 01/28/2018   PLT 330 01/28/2018   Lab Results  Component Value Date   NA 144 10/26/2018   K 4.9 10/26/2018   CO2 21 10/26/2018   GLUCOSE 193 (H) 10/26/2018   BUN 10 10/26/2018   CREATININE 0.90 10/26/2018   BILITOT 0.4 10/26/2018   ALKPHOS 65 10/26/2018   AST 13 10/26/2018   ALT 10 10/26/2018   PROT 6.9 10/26/2018   ALBUMIN 4.4 10/26/2018   CALCIUM 10.2 10/26/2018   GFR 94.77 02/19/2014   Lab Results  Component Value Date   CHOL 228 (H) 10/26/2018   Lab Results  Component Value Date   HDL 54 10/26/2018   Lab Results  Component Value Date   LDLCALC 154 (H) 10/26/2018   Lab Results  Component Value Date   TRIG 102 10/26/2018   Lab Results  Component Value Date   CHOLHDL 4.2 10/26/2018   Lab Results  Component Value Date   HGBA1C 7.7 (A) 10/26/2018      Assessment & Plan:   Problem List Items Addressed This Visit      Cardiovascular and Mediastinum   Hypertension  - Patient's blood pressure is at goal of 139/89 or less. Condition is stable. Continue current medications and treatment plan. I recommend that you exercise for 30-45 minutes 5 days a week. I also recommend a balanced diet with fruits and vegetables every day, lean meats, and little fried foods. The DASH diet (you can find this online) is a good example of this.    Relevant Orders   Lipid  panel   CMP14+EGFR   Hemoglobin A1c     Endocrine   Type 2 diabetes mellitus ([place]) - Primary  -  well controlled hemoglobin a1c is at goal Continue exercise Lipids monitored and renal function in range On metformin On arb On asa 34m Reviewed diabetic foot care Emphasized importance of eye and dental exam      Relevant Orders   Lipid panel   CMP14+EGFR   Hemoglobin A1c    Other Visit Diagnoses    Dyslipidemia    Discussed medications that affect lipids. She is statin intolerant Reminded patient to avoid grapefruits Reviewed last 3 lipids Discussed current meds: statin, aspirin Advised dietary fiber and fish oil and ways to keep HDL high CAD prevention and reviewed side effects of statins   Relevant Orders   Lipid panel   CMP14+EGFR   Hemoglobin A1c      No orders of the defined types were placed in this encounter.   Follow-up: Return in about 3 months (around 02/18/2019) for hypertension .    ZForrest Moron MD

## 2018-11-18 NOTE — Patient Instructions (Addendum)
   If you have lab work done today you will be contacted with your lab results within the next 2 weeks.  If you have not heard from us then please contact us. The fastest way to get your results is to register for My Chart.   IF you received an x-ray today, you will receive an invoice from [facility]. Please contact [facility] at [phone] with questions or concerns regarding your invoice.   IF you received labwork today, you will receive an invoice from [facility]. Please contact [facility] at [phone] with questions or concerns regarding your invoice.   Our billing staff will not be able to assist you with questions regarding bills from these companies.  You will be contacted with the lab results as soon as they are available. The fastest way to get your results is to activate your My Chart account. Instructions are located on the last page of this paperwork. If you have not heard from us regarding the results in 2 weeks, please contact this office.     Heart Disease Prevention Heart disease is the leading cause of death in the world. Coronary artery disease is the most common cause of heart disease. This condition results when cholesterol and other substances (plaque) build up inside the walls of the blood vessels that supply your heart muscle (arteries). This buildup in arteries is called atherosclerosis. You can take actions to lower your risk of heart disease. How can heart disease affect me? Heart disease can cause many unpleasant symptoms and complications, such as:  Chest pain (angina).  Reduced or blocked blood flow to your heart. This can cause: ? Irregular heartbeats (arrhythmias). ? Heart attack. ? Heart failure. What can increase my risk? The following factors may make you more likely to develop this condition:  High blood pressure (hypertension).  High cholesterol.  Smoking.  A diet high in saturated fats or trans fats.  Lack of physical  activity.  Obesity.  Drinking too much alcohol.  Diabetes.  Having a family history of heart disease. What actions can I take to prevent heart disease? Nutrition   Eat a heart-healthy eating plan as told by your health care provider. Examples include the DASH (Dietary Approaches to Stop Hypertension) eating plan or the Mediterranean diet.  Generally, it is recommended that you: ? Eat less salt (sodium). Ask your health care provider how much sodium is safe for you. Most people should have less than 2,300 mg each day. ? Limit unhealthy fats, such as saturated and trans fats, in your diet. You can do this by eating low-fat dairy products, eating less red meat, and avoiding processed foods. ? Eat healthy fats (omega-3 fatty acids). These are found in fish, such as mackerel or salmon. ? Eat more fruits and vegetables. You should try to fill one-half of your plate with fruits and vegetables at each meal. ? Eat more whole grains. ? Avoid foods and drinks that have added sugars. Lifestyle   Get regular exercise. This is one of the most important things you can do for your health. Generally, it is recommended that you: ? Exercise for at least 30 minutes on most days of the week (150 minutes each week). The exercise should increase your heart rate and make you sweat (aerobic exercise). ? Add strength exercises on at least 2 days each week.  Do not use any products that contain nicotine or tobacco, such as cigarettes and e-cigarettes. These can damage your heart and blood vessels. If   you need help quitting, ask your health care provider. Alcohol use  Do not drink alcohol if: ? Your health care provider tells you not to drink. ? You are pregnant, may be pregnant, or are planning to become pregnant.  If you drink alcohol, limit how much you have: ? 0-1 drink a day for women. ? 0-2 drinks a day for men.  Be aware of how much alcohol is in your drink. In the [place] one drink equals one  typical bottle of beer (12 oz), one-half glass of wine (5 oz), or one shot of hard liquor (1 oz). Medicines  Take over-the-counter and prescription medicines only as told by your health care provider.  Ask your health care provider whether you should take an aspirin every day. Taking aspirin may help reduce your risk of heart disease and stroke.  Depending on your risk factors, your health care provider may prescribe medicines to lower your risk of heart disease or to control related conditions. You may take medicine to: ? Lower cholesterol. ? Control blood pressure. ? Control diabetes. General information  Keep your blood pressure under control, as recommended by your health care provider. For most healthy people, the upper number of your blood pressure (systolic) should be no higher than 120, and the lower number (diastolic) no higher than 80. Treatment may be needed if your blood pressure is higher than 130/80.  Have your blood pressure checked at least every two years. Your health care provider may check your blood pressure more often if you have high blood pressure.  After age 20, have your cholesterol checked every 4-6 years. If you have risk factors for heart disease, you may need to have it checked more frequently. Treatment may be needed if your cholesterol is high.  Have your body mass index (BMI) checked every year. Your health care provider can calculate your BMI from your height and weight.  Work with your health care provider to lose weight, if needed, or to maintain a healthy weight. Where to find more information:  Centers for Disease Control and Prevention: www.cdc.gov/heartdisease  American Heart Association: www.heart.org ? Take a free online heart disease risk quiz to better understand your personal risk factors. Summary  Heart disease is the leading cause of death in the world.  Heart disease can cause chest pain, abnormal heart rhythms, heart attack, and heart  failure.  High blood pressure, high cholesterol, and smoking are the main risk factors for heart disease, although other factors also contribute.  You can take actions to lower your chances of developing heart disease. Work with your health care provider to reduce your risk by following a heart-healthy diet, being physically active, and controlling your weight, blood pressure, and cholesterol level. This information is not intended to replace advice given to you by your health care provider. Make sure you discuss any questions you have with your health care provider. Document Released: 04/07/2004 Document Revised: 09/08/2017 Document Reviewed: 09/08/2017 Elsevier Interactive Patient Education  2019 Elsevier Inc.  

## 2018-11-19 ENCOUNTER — Telehealth: Payer: Self-pay | Admitting: Family Medicine

## 2018-11-19 NOTE — Telephone Encounter (Signed)
Please let the patient know her urine was clear. I would like to see her back in the office in 3 months.

## 2018-11-21 ENCOUNTER — Other Ambulatory Visit: Payer: Self-pay

## 2018-11-21 ENCOUNTER — Ambulatory Visit (INDEPENDENT_AMBULATORY_CARE_PROVIDER_SITE_OTHER): Admitting: Internal Medicine

## 2018-11-21 ENCOUNTER — Encounter: Payer: Self-pay | Admitting: Internal Medicine

## 2018-11-21 VITALS — BP 144/86 | HR 96 | Ht 66.0 in | Wt 183.4 lb

## 2018-11-21 DIAGNOSIS — E119 Type 2 diabetes mellitus without complications: Secondary | ICD-10-CM

## 2018-11-21 DIAGNOSIS — E782 Mixed hyperlipidemia: Secondary | ICD-10-CM | POA: Diagnosis not present

## 2018-11-21 DIAGNOSIS — I159 Secondary hypertension, unspecified: Secondary | ICD-10-CM

## 2018-11-21 NOTE — Telephone Encounter (Signed)
Informed pt of urine results, she verbalized understanding.

## 2018-11-21 NOTE — Patient Instructions (Signed)
Medication Instructions:  NO CHANGES If you need a refill on your cardiac medications before your next appointment, please call your pharmacy.   Lab work: IN 6 MONTHS NEED FASTING LIPID - PRIOR TO APPOINTMENT If you have labs (blood work) drawn today and your tests are completely normal, you will receive your results only by: Marland Kitchen MyChart Message (if you have MyChart) OR . A paper copy in the mail If you have any lab test that is abnormal or we need to change your treatment, we will call you to review the results.  Testing/Procedures: CT coronary calcium score. This test is done at [address]. This is $150 out of pocket.   Coronary CalciumScan A coronary calcium scan is an imaging test used to look for deposits of calcium and other fatty materials (plaques) in the inner lining of the blood vessels of the heart (coronary arteries). These deposits of calcium and plaques can partly clog and narrow the coronary arteries without producing any symptoms or warning signs. This puts a person at risk for a heart attack. This test can detect these deposits before symptoms develop. Tell a health care provider about:  Any allergies you have.  All medicines you are taking, including vitamins, herbs, eye drops, creams, and over-the-counter medicines.  Any problems you or family members have had with anesthetic medicines.  Any blood disorders you have.  Any surgeries you have had.  Any medical conditions you have.  Whether you are pregnant or may be pregnant. What are the risks? Generally, this is a safe procedure. However, problems may occur, including:  Harm to a pregnant woman and her unborn baby. This test involves the use of radiation. Radiation exposure can be dangerous to a pregnant woman and her unborn baby. If you are pregnant, you generally should not have this procedure done.  Slight increase in the risk of cancer. This is because of the radiation involved in the  test. What happens before the procedure? No preparation is needed for this procedure. What happens during the procedure?  You will undress and remove any jewelry around your neck or chest.  You will put on a hospital gown.  Sticky electrodes will be placed on your chest. The electrodes will be connected to an electrocardiogram (ECG) machine to record a tracing of the electrical activity of your heart.  A CT scanner will take pictures of your heart. During this time, you will be asked to lie still and hold your breath for 2-3 seconds while a picture of your heart is being taken. The procedure may vary among health care providers and hospitals. What happens after the procedure?  You can get dressed.  You can return to your normal activities.  It is up to you to get the results of your test. Ask your health care provider, or the department that is doing the test, when your results will be ready. Summary  A coronary calcium scan is an imaging test used to look for deposits of calcium and other fatty materials (plaques) in the inner lining of the blood vessels of the heart (coronary arteries).  Generally, this is a safe procedure. Tell your health care provider if you are pregnant or may be pregnant.  No preparation is needed for this procedure.  A CT scanner will take pictures of your heart.  You can return to your normal activities after the scan is done. This information is not intended to replace advice given to you by your  health care provider. Make sure you discuss any questions you have with your health care provider. Document Released: 02/20/2008 Document Revised: 07/13/2016 Document Reviewed: 07/13/2016 Elsevier Interactive Patient Education  2017 [place]: At [facility], you and your health needs are our priority.  As part of our continuing mission to provide you with exceptional heart care, we have created designated Provider Care Teams.  These Care  Teams include your primary Cardiologist (physician) and Advanced Practice Providers (APPs -  Physician Assistants and Nurse Practitioners) who all work together to provide you with the care you need, when you need it. . Your physician recommends that you schedule a follow-up appointment in [place] .   Any Other Special Instructions Will Be Listed Below (If Applicable). WORK ON LOW CHOLESTEROL DIET  HANDOUTS GIVEN

## 2018-11-21 NOTE — Progress Notes (Signed)
LIPID CLINIC CONSULT NOTE  Chief Complaint:  dyslipidemia  Primary Care Physician: Alexis Moron, MD  Primary Cardiologist:  No primary care provider on file.  HPI:  Alexis Shields is Shields 63 y.o. female who is being seen today for the evaluation of dyslipidemia at the request of Alexis Shields, Alexis A, MD. This is Shields pleasant 63 year old female with Shields history of type 2 diabetes (hemoglobin A1c 7.7), hypertension and GERD, who is kindly referred for evaluation and management of dyslipidemia.  Most recently, she had lab work in February 2020 which showed Shields total cholesterol 228, HDL 54, LDL 154 and triglycerides of 102.  Given her cardiovascular risk factors 1 could argue her LDL target should be less than 100 if not lower, but unfortunately she has refused to take statin therapy.  She does not have Shields history of statin intolerance and has not previously failed Shields statin, rather she has concerns about side effects as she knows several people who have had side effects on the medication.  She tells me she has had Shields better lipid profile previously but over the past several months has had some weight gain and dietary changes as she has been under stress helping to care for her sister.  This may reflect some worsening of her lipid profile as well as her hemoglobin A1c.  She is asymptomatic, denying any chest pain or worsening shortness of breath.  PMHx:  Past Medical History:  Diagnosis Date  . Anemia   . Back pain   . GERD (gastroesophageal reflux disease)   . Hyperlipidemia   . Hypertension   . Overweight (BMI 25.0-29.9)   . Type 2 diabetes mellitus ([place])     Past Surgical History:  Procedure Laterality Date  . BREAST BIOPSY Right     FAMHx:  Family History  Problem Relation Age of Onset  . Kidney failure Mother   . Diabetes Mother   . Alcohol abuse Father   . Liver disease Father   . Lupus Sister   . Mental illness Sister        Bipolar disorder    SOCHx:   reports that she  has never smoked. She has never used smokeless tobacco. She reports current alcohol use of about 2.0 standard drinks of alcohol per week. She reports that she does not use drugs.  ALLERGIES:  Allergies  Allergen Reactions  . Fexofenadine Hcl   . Influenza Vaccines   . Lisinopril Cough  . Statins Other (See Comments)    Pt refuses    ROS: Pertinent items noted in HPI and remainder of comprehensive ROS otherwise negative.  HOME MEDS: Current Outpatient Medications on File Prior to Visit  Medication Sig Dispense Refill  . aspirin EC 81 MG tablet Take 81 mg by mouth daily.    Marland Kitchen losartan (COZAAR) 50 MG tablet Take 1 tablet (50 mg total) by mouth daily. 30 tablet 0  . metFORMIN (GLUCOPHAGE) 500 MG tablet Take 500 mg by mouth 2 (two) times daily with Shields meal.    . verapamil (CALAN-SR) 120 MG CR tablet Take 1 tablet (120 mg total) by mouth at bedtime. 30 tablet 0   No current facility-administered medications on file prior to visit.     LABS/IMAGING: No results found for this or any previous visit (from the past 48 hour(s)). No results found.  LIPID PANEL:    Component Value Date/Time   CHOL 228 (H) 10/26/2018 0949   TRIG 102 10/26/2018 0949  HDL 54 10/26/2018 0949   CHOLHDL 4.2 10/26/2018 0949   CHOLHDL 4.1 07/28/2016 0827   VLDL 13 07/28/2016 0827   LDLCALC 154 (H) 10/26/2018 0949   LDLDIRECT 153.3 08/29/2012 0955    WEIGHTS: Wt Readings from Last 3 Encounters:  11/21/18 183 lb 6.4 oz (83.2 kg)  11/18/18 181 lb (82.1 kg)  10/26/18 179 lb 12.8 oz (81.6 kg)    VITALS: BP (!) 144/86   Pulse 96   Ht 5\' 6"  (1.676 m)   Wt 183 lb 6.4 oz (83.2 kg)   BMI 29.60 kg/m   EXAM: General appearance: alert and no distress Neck: no carotid bruit, no JVD, thyroid not enlarged, symmetric, no tenderness/mass/nodules and No corneal arcus Lungs: clear to auscultation bilaterally Heart: regular rate and rhythm, S1, S2 normal, no murmur, click, rub or gallop Abdomen: soft, non-tender;  bowel sounds normal; no masses,  no organomegaly Extremities: extremities normal, atraumatic, no cyanosis or edema and No tendon xanthomas Pulses: 2+ and symmetric Skin: Skin color, texture, turgor normal. No rashes or lesions Neurologic: Grossly normal Psych: Pleasant  EKG: Deferred  ASSESSMENT: 1. Mixed dyslipidemia 2. Diabetes type 2 3. Hypertension  PLAN: 1.   Alexis Shields has mixed dyslipidemia with Shields goal LDL of less than 100 or perhaps lower although no significant family history of heart disease.  Hemoglobin A1c is above goal less than 7.  She recently has had weight gain and some dietary indiscretions.  I recommended 6 months of aggressive diet changes however if she is not able to reach Shields target LDL less than 70, would consider therapy.  She has been very hesitant to take statins because of anecdotal concerns with family members that have been intolerant of it.  She understands it there are no significant reversible side effects of the medications and that just because her family members may have had side effects that does not mean she may.  She is willing to possibly consider it in the future if necessary or we can try some statin alternative such as ezetimibe or perhaps newly FDA approved bempedoic acid.  Plan follow-up with me in 6 months with Shields fasting lipid profile.  Thanks again for the kind referral.  Alexis Casino, MD, FACC, [place] Director of the [facility]  [facility] Diplomate of the American Board of Clinical Lipidology Attending Cardiologist  Direct Dial: [phone]  Fax: [phone]  Website:  [email or web address] Alexis Shields 11/21/2018, 1:08 PM

## 2018-11-22 ENCOUNTER — Other Ambulatory Visit: Payer: Self-pay | Admitting: Family Medicine

## 2018-11-22 NOTE — Telephone Encounter (Signed)
Requested Prescriptions  Pending Prescriptions Disp Refills  . losartan (COZAAR) 50 MG tablet [Pharmacy Med Name: LOSARTAN 50MG  TABLETS] 90 tablet 1    Sig: TAKE 1 TABLET(50 MG) BY MOUTH DAILY     Cardiovascular:  Angiotensin Receptor Blockers Failed - 11/22/2018  3:45 AM      Failed - Last BP in normal range    BP Readings from Last 1 Encounters:  11/21/18 (!) 144/86         Passed - Cr in normal range and within 180 days    Creat  Date Value Ref Range Status  07/28/2016 0.85 0.50 - 0.99 mg/dL Final    Comment:      For patients > or = 63 years of age: The upper reference limit for Creatinine is approximately 13% higher for people identified as African-American.      Creatinine, Ser  Date Value Ref Range Status  10/26/2018 0.90 0.44 - 1.00 mg/dL Final         Passed - K in normal range and within 180 days    Potassium  Date Value Ref Range Status  10/26/2018 4.9 3.5 - 5.2 mmol/L Final         Passed - Patient is not pregnant      Passed - Valid encounter within last 6 months    Recent Outpatient Visits          4 days ago Type 2 diabetes mellitus with diabetic polyneuropathy, without long-term current use of insulin ([place])   Primary Care at [facility], Arlie Solomons, MD   3 weeks ago Essential hypertension   Primary Care at [facility], Arlie Solomons, MD   4 months ago Essential hypertension   Primary Care at [facility], Arlie Solomons, MD   7 months ago Essential hypertension   Primary Care at [facility], [place] A, MD   9 months ago Routine physical examination   Primary Care at [facility], Renette Butters, MD           . verapamil (CALAN-SR) 120 MG CR tablet [Pharmacy Med Name: VERAPAMIL ER 120MG  TABLETS] 30 tablet 0    Sig: TAKE 1 TABLET(120 MG) BY MOUTH AT BEDTIME     Cardiovascular:  Calcium Channel Blockers Failed - 11/22/2018  3:45 AM      Failed - Last BP in normal range    BP Readings from Last 1 Encounters:  11/21/18 (!) 144/86         Passed - Valid  encounter within last 6 months    Recent Outpatient Visits          4 days ago Type 2 diabetes mellitus with diabetic polyneuropathy, without long-term current use of insulin ([place])   Primary Care at [facility], Arlie Solomons, MD   3 weeks ago Essential hypertension   Primary Care at [facility], Arlie Solomons, MD   4 months ago Essential hypertension   Primary Care at [facility], Arlie Solomons, MD   7 months ago Essential hypertension   Primary Care at [facility], Arlie Solomons, MD   9 months ago Routine physical examination   Primary Care at [facility], Renette Butters, MD

## 2018-11-29 ENCOUNTER — Ambulatory Visit

## 2018-12-09 ENCOUNTER — Ambulatory Visit: Admitting: Internal Medicine

## 2018-12-13 ENCOUNTER — Telehealth: Payer: Self-pay | Admitting: *Deleted

## 2018-12-13 NOTE — Telephone Encounter (Signed)
Called patient and made her aware [place] [facility] was cancelling her appointment for CT CA Score due to COVID restrictions and made her aware we will call back to reschedule after restrictions have been lifted.  Patient understood and thanked me for the call.

## 2018-12-21 ENCOUNTER — Other Ambulatory Visit: Payer: Self-pay

## 2018-12-21 ENCOUNTER — Inpatient Hospital Stay: Admission: RE | Admit: 2018-12-21 | Source: Ambulatory Visit

## 2018-12-21 ENCOUNTER — Telehealth: Payer: Self-pay | Admitting: Family Medicine

## 2018-12-21 DIAGNOSIS — E1142 Type 2 diabetes mellitus with diabetic polyneuropathy: Secondary | ICD-10-CM

## 2018-12-21 MED ORDER — METFORMIN HCL 500 MG PO TABS: 500.0000 mg | ORAL_TABLET | Freq: Two times a day (BID) | ORAL | 3 refills | Status: AC

## 2018-12-21 NOTE — Telephone Encounter (Signed)
Rx sent to pharmacy   

## 2018-12-21 NOTE — Telephone Encounter (Signed)
Copied from [employer] [id number]. Topic: Quick Communication - Rx Refill/Question >> Dec 21, 2018 12:04 PM Ahmed Prima L wrote: Medication: metFORMIN (GLUCOPHAGE) 500 MG tablet  Has the patient contacted their pharmacy? [facility] & said Dr Nolon Rod did not send this in (Agent: If no, request that the patient contact the pharmacy for the refill.) (Agent: If yes, when and what did the pharmacy advise?)  Preferred Pharmacy (with phone number or street name): [facility] - [address], [place] [address] Phone: [phone] Fax: [phone]    Agent: Please be advised that RX refills may take up to 3 business days. We ask that you follow-up with your pharmacy.

## 2019-01-14 IMAGING — MG DIGITAL SCREENING BILATERAL MAMMOGRAM WITH TOMO AND CAD
8 series · 8 of 24 positions shown · non-contrast
Comparison: Previous exam(s).

CLINICAL DATA: Screening.

EXAM:
DIGITAL SCREENING BILATERAL MAMMOGRAM WITH TOMO AND CAD

[R MLO synth-2D]
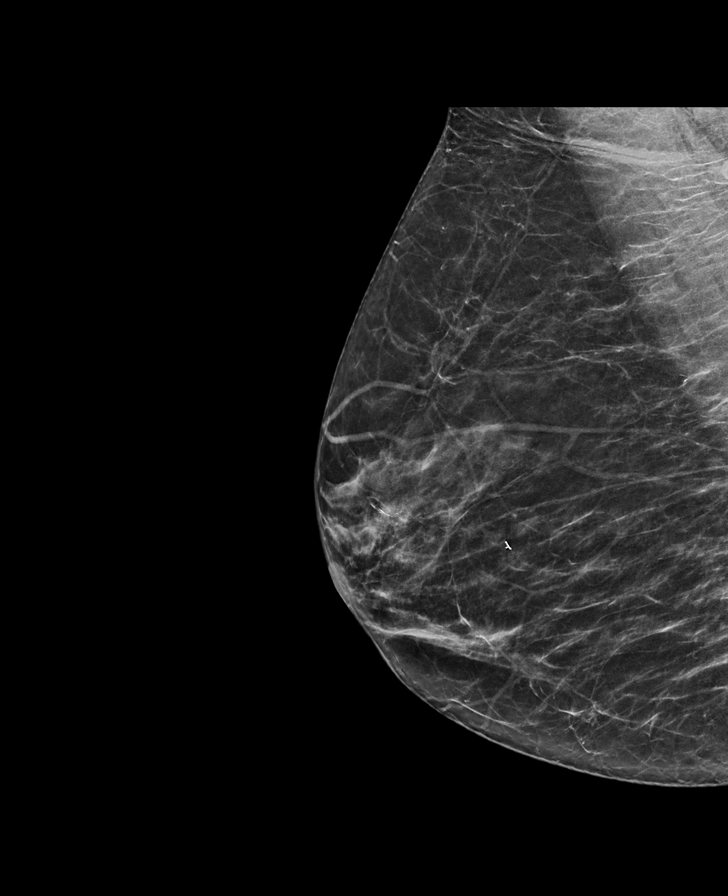

[L MLO synth-2D]
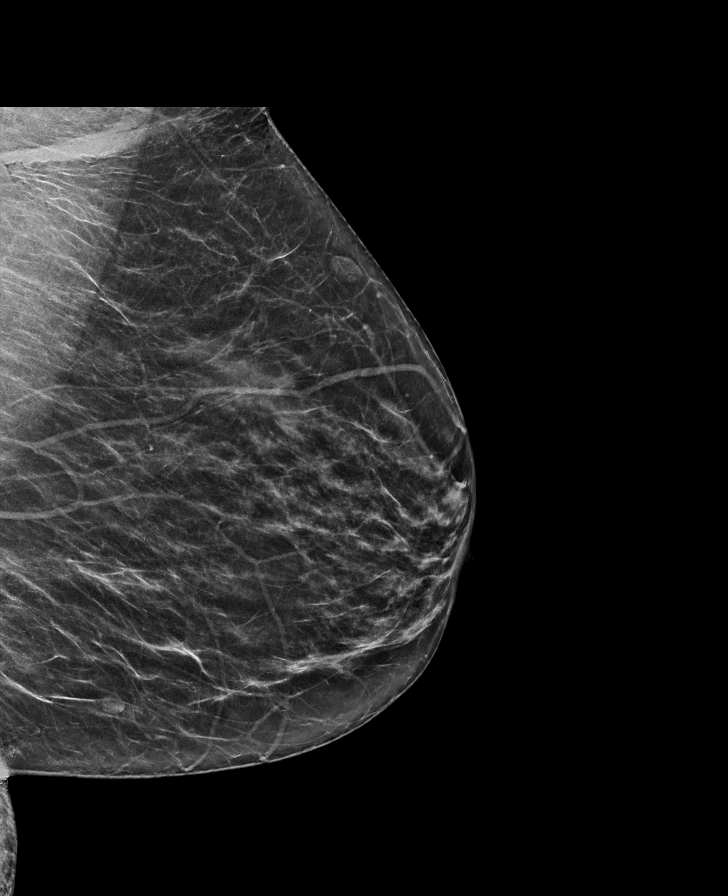

[L CC synth-2D]
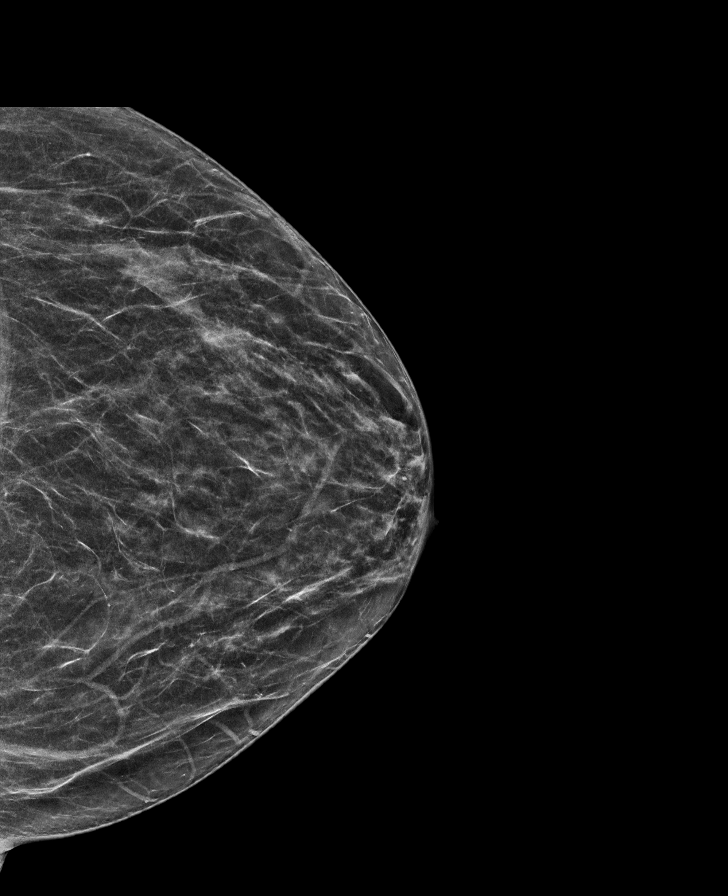

[R CC synth-2D]
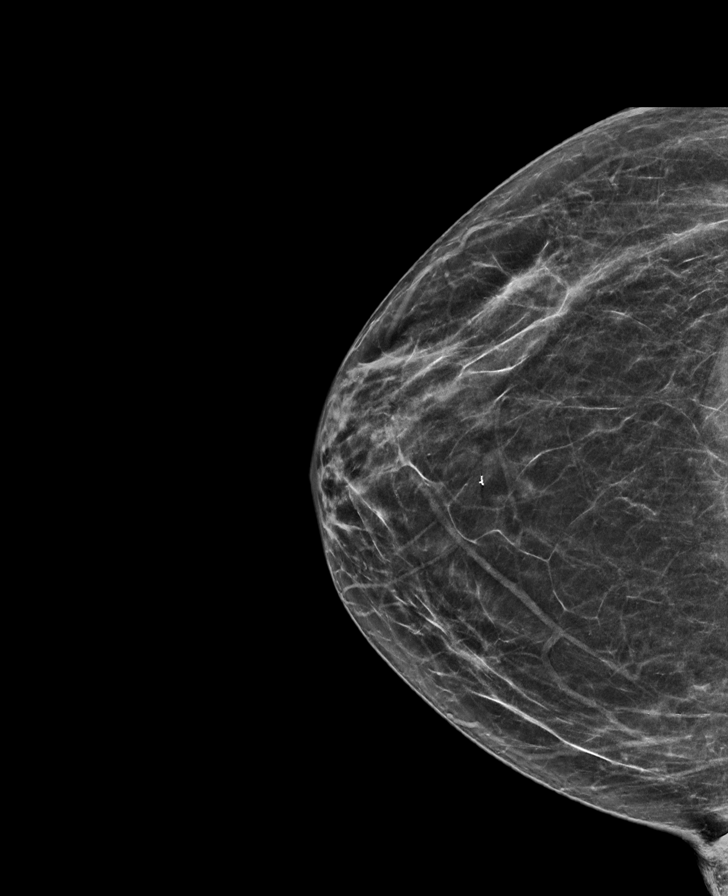

[L MLO tomo · tomo slice 33/65.0]
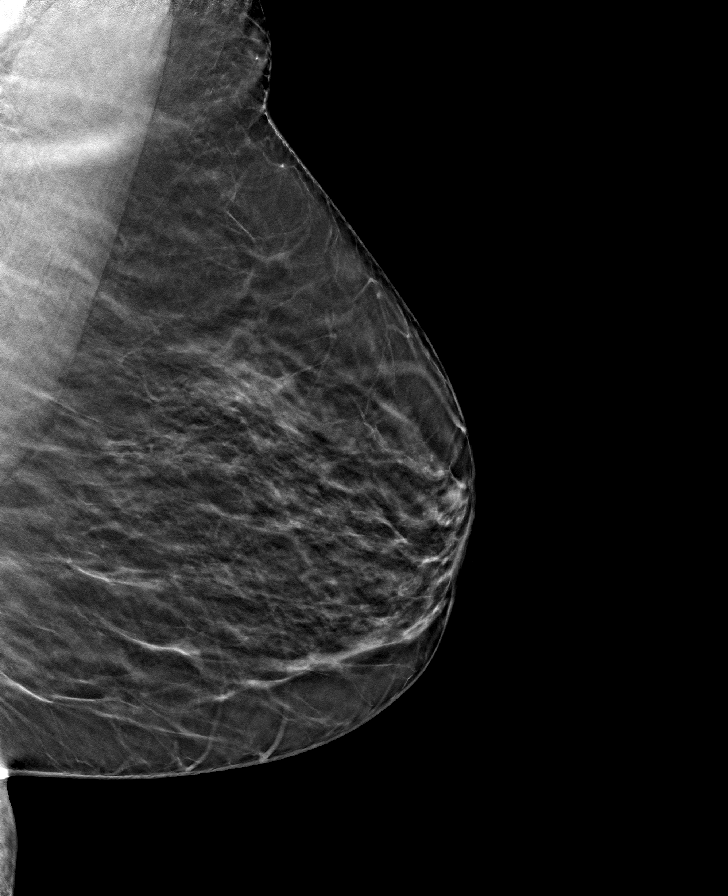

[L CC tomo · tomo slice 31/62.0]
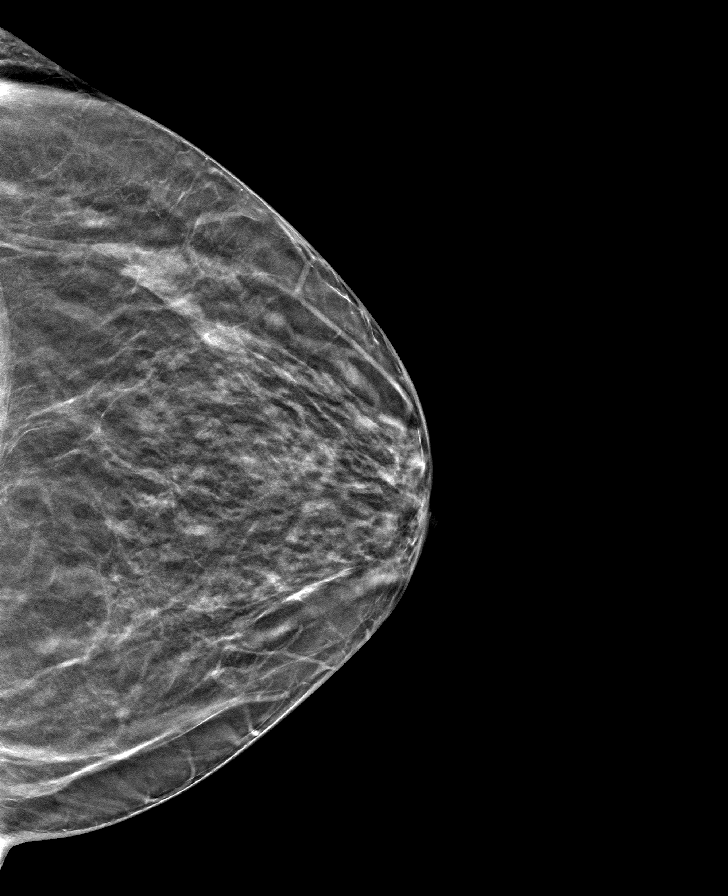

[R MLO tomo · tomo slice 33/66.0]
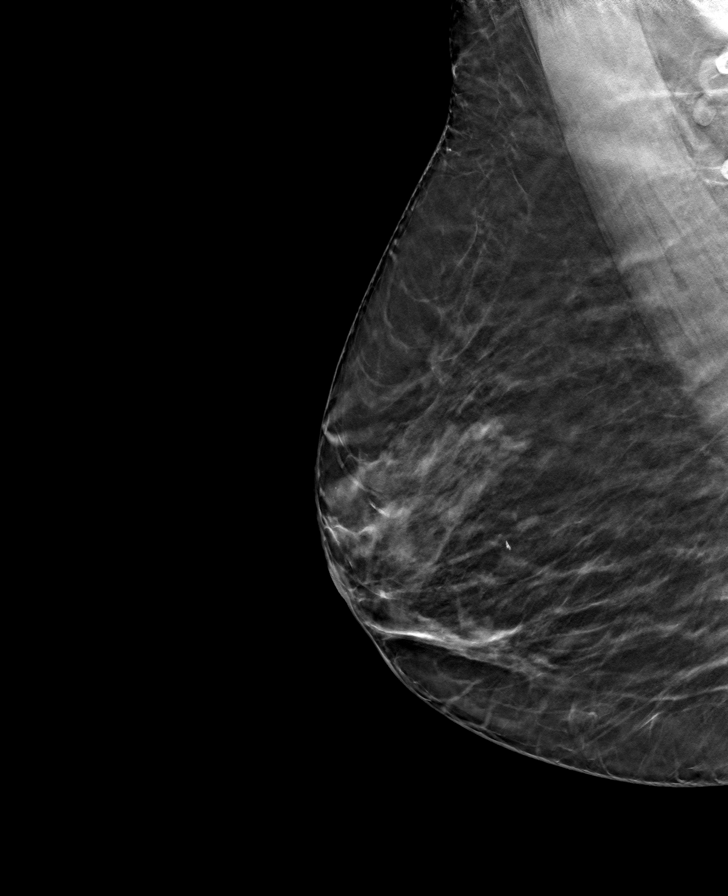

[R CC tomo · tomo slice 33/66.0]
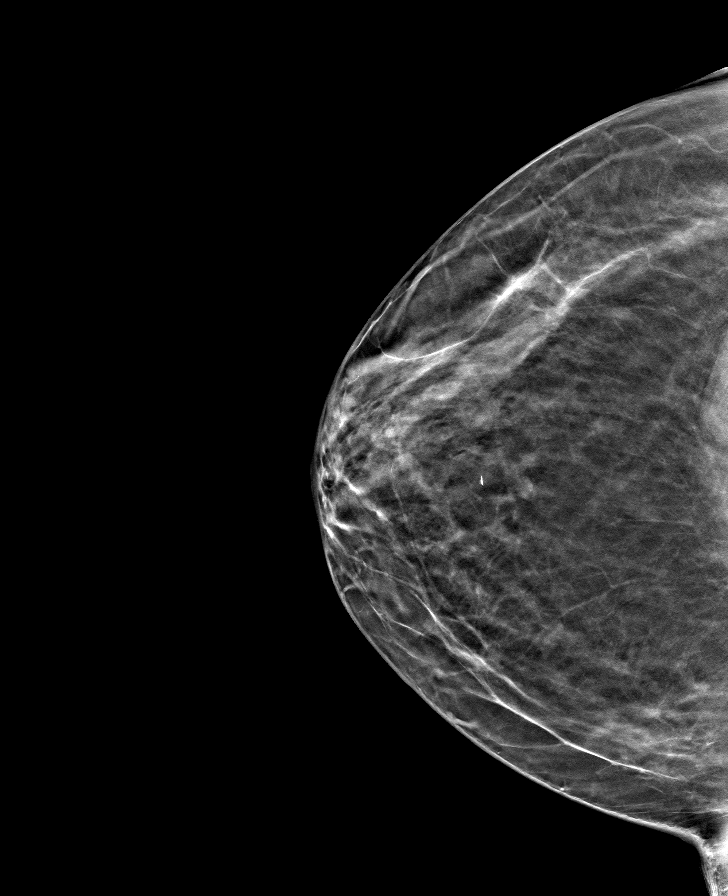

[8 of 24 positions shown; findings below may reference images not displayed]

ACR Breast Density Category b: There are scattered areas of
fibroglandular density.
FINDINGS: There are no findings suspicious for malignancy. Images were
processed with CAD.
IMPRESSION: No mammographic evidence of malignancy. A result letter of this
screening mammogram will be mailed directly to the patient.

RECOMMENDATION:
Screening mammogram in one year. (Code:[id number])

BI-RADS CATEGORY  1: Negative.

## 2019-05-04 ENCOUNTER — Other Ambulatory Visit: Payer: Self-pay | Admitting: Family Medicine

## 2019-05-04 NOTE — Telephone Encounter (Signed)
Forwarding medication refill request to the clinical pool for review. 

## 2019-06-15 ENCOUNTER — Other Ambulatory Visit: Payer: Self-pay | Admitting: Internal Medicine

## 2019-06-15 ENCOUNTER — Other Ambulatory Visit: Payer: Self-pay | Admitting: Family Medicine

## 2019-06-15 DIAGNOSIS — Z1231 Encounter for screening mammogram for malignant neoplasm of breast: Secondary | ICD-10-CM

## 2019-07-31 ENCOUNTER — Other Ambulatory Visit: Payer: Self-pay

## 2019-07-31 ENCOUNTER — Ambulatory Visit
Admission: RE | Admit: 2019-07-31 | Discharge: 2019-07-31 | Disposition: A | Discharge status: Home / Self Care | Source: Ambulatory Visit | Attending: Internal Medicine | Admitting: Internal Medicine

## 2019-07-31 DIAGNOSIS — Z1231 Encounter for screening mammogram for malignant neoplasm of breast: Secondary | ICD-10-CM

## 2019-12-06 ENCOUNTER — Other Ambulatory Visit: Payer: Self-pay

## 2019-12-06 ENCOUNTER — Ambulatory Visit (AMBULATORY_SURGERY_CENTER): Payer: Self-pay

## 2019-12-06 VITALS — Temp 96.8°F | Ht 65.0 in | Wt 178.2 lb

## 2019-12-06 DIAGNOSIS — Z01818 Encounter for other preprocedural examination: Secondary | ICD-10-CM

## 2019-12-06 DIAGNOSIS — Z1211 Encounter for screening for malignant neoplasm of colon: Secondary | ICD-10-CM

## 2019-12-06 MED ORDER — NA SULFATE-K SULFATE-MG SULF 17.5-3.13-1.6 GM/177ML PO SOLN: 1.0000 | Freq: Once | ORAL | 0 refills | Status: AC

## 2019-12-06 NOTE — Progress Notes (Signed)
No allergies to soy or egg Pt is not on blood thinners or diet pills Denies issues with sedation/intubation Denies atrial flutter/fib Denies constipation   Emmi instructions given to pt  Pt is aware of Covid safety and care partner requirements.  

## 2019-12-18 ENCOUNTER — Other Ambulatory Visit: Payer: Self-pay | Admitting: Gastroenterology

## 2019-12-18 ENCOUNTER — Ambulatory Visit (INDEPENDENT_AMBULATORY_CARE_PROVIDER_SITE_OTHER)

## 2019-12-18 ENCOUNTER — Other Ambulatory Visit: Payer: Self-pay

## 2019-12-18 DIAGNOSIS — Z1159 Encounter for screening for other viral diseases: Secondary | ICD-10-CM

## 2019-12-18 LAB — SARS CORONAVIRUS 2 (TAT 6-24 HRS): SARS Coronavirus 2: NEGATIVE

## 2019-12-20 ENCOUNTER — Encounter: Payer: Self-pay | Admitting: Gastroenterology

## 2019-12-20 ENCOUNTER — Ambulatory Visit (AMBULATORY_SURGERY_CENTER): Admitting: Gastroenterology

## 2019-12-20 ENCOUNTER — Other Ambulatory Visit: Payer: Self-pay

## 2019-12-20 VITALS — BP 140/89 | HR 79 | Temp 96.6°F | Resp 16 | Ht 65.0 in | Wt 178.0 lb

## 2019-12-20 DIAGNOSIS — D124 Benign neoplasm of descending colon: Secondary | ICD-10-CM | POA: Diagnosis not present

## 2019-12-20 DIAGNOSIS — D128 Benign neoplasm of rectum: Secondary | ICD-10-CM | POA: Diagnosis not present

## 2019-12-20 DIAGNOSIS — D129 Benign neoplasm of anus and anal canal: Secondary | ICD-10-CM

## 2019-12-20 DIAGNOSIS — Z1211 Encounter for screening for malignant neoplasm of colon: Secondary | ICD-10-CM | POA: Diagnosis present

## 2019-12-20 MED ORDER — SODIUM CHLORIDE 0.9 % IV SOLN: 500.0000 mL | Freq: Once | INTRAVENOUS | Status: DC

## 2019-12-20 NOTE — Progress Notes (Signed)
Called to room to assist during endoscopic procedure.  Patient ID and intended procedure confirmed with present staff. Received instructions for my participation in the procedure from the performing physician.  

## 2019-12-20 NOTE — Op Note (Signed)
[place] Patient Name: Alexis Shields Procedure Date: 12/20/2019 10:44 AM MRN: [id number] Endoscopist: Thornton Park MD, MD Age: 64 Referring MD:  Date of Birth: 1955/11/20 Gender: Female Account #: [id number] Procedure:                Colonoscopy Indications:              Screening for colorectal malignant neoplasm                           No known family history of colon cancer or polyps                           Patient reported normal colonoscopy in [place]                            10 years ago Medicines:                Monitored Anesthesia Care Procedure:                Pre-Anesthesia Assessment:                           - Prior to the procedure, a History and Physical                            was performed, and patient medications and                            allergies were reviewed. The patient's tolerance of                            previous anesthesia was also reviewed. The risks                            and benefits of the procedure and the sedation                            options and risks were discussed with the patient.                            All questions were answered, and informed consent                            was obtained. Prior Anticoagulants: The patient has                            taken no previous anticoagulant or antiplatelet                            agents. ASA Grade Assessment: II - A patient with                            mild systemic disease. After reviewing the risks  and benefits, the patient was deemed in                            satisfactory condition to undergo the procedure.                           After obtaining informed consent, the colonoscope                            was passed under direct vision. Throughout the                            procedure, the patient's blood pressure, pulse, and                            oxygen saturations were monitored continuously. The                             Colonoscope was introduced through the anus and                            advanced to the 3 cm into the ileum. A second                            forward view of the right colon was performed. The                            colonoscopy was performed without difficulty. The                            patient tolerated the procedure well. The quality                            of the bowel preparation was excellent. The                            terminal ileum, ileocecal valve, appendiceal                            orifice, and rectum were photographed. Scope In: 10:54:14 AM Scope Out: 11:06:53 AM Scope Withdrawal Time: 0 hours 8 minutes 56 seconds  Total Procedure Duration: 0 hours 12 minutes 39 seconds  Findings:                 The perianal and digital rectal examinations were                            normal.                           A less than 1 mm polyp was found in the rectum. The                            polyp was sessile. The polyp was removed with a  cold snare. Resection and retrieval were complete.                            Estimated blood loss was minimal.                           A 1 mm polyp was found in the descending colon. The                            polyp was sessile. The polyp was removed with a                            cold snare. Resection and retrieval were complete.                            Estimated blood loss was minimal.                           The exam was otherwise without abnormality on                            direct and retroflexion views. Complications:            No immediate complications. Estimated blood loss:                            Minimal. Estimated Blood Loss:     Estimated blood loss was minimal. Impression:               - One less than 1 mm polyp in the rectum, removed                            with a cold snare. Resected and retrieved.                           - One  1 mm polyp in the descending colon, removed                            with a cold snare. Resected and retrieved.                           - The examination was otherwise normal on direct                            and retroflexion views. Recommendation:           - Patient has a contact number available for                            emergencies. The signs and symptoms of potential                            delayed complications were discussed with the  patient. Return to normal activities tomorrow.                            Written discharge instructions were provided to the                            patient.                           - Resume previous diet.                           - Continue present medications.                           - Await pathology results.                           - Repeat colonoscopy date to be determined after                            pending pathology results are reviewed for                            surveillance. Thornton Park MD, MD 12/20/2019 11:11:44 AM This report has been signed electronically.

## 2019-12-20 NOTE — Patient Instructions (Signed)
Handout given for polyps.  Await pathology results.  YOU HAD AN ENDOSCOPIC PROCEDURE TODAY AT THE [facility]:   Refer to the procedure report that was given to you for any specific questions about what was found during the examination.  If the procedure report does not answer your questions, please call your gastroenterologist to clarify.  If you requested that your care partner not be given the details of your procedure findings, then the procedure report has been included in a sealed envelope for you to review at your convenience later.  YOU SHOULD EXPECT: Some feelings of bloating in the abdomen. Passage of more gas than usual.  Walking can help get rid of the air that was put into your GI tract during the procedure and reduce the bloating. If you had a lower endoscopy (such as a colonoscopy or flexible sigmoidoscopy) you may notice spotting of blood in your stool or on the toilet paper. If you underwent a bowel prep for your procedure, you may not have a normal bowel movement for a few days.  Please Note:  You might notice some irritation and congestion in your nose or some drainage.  This is from the oxygen used during your procedure.  There is no need for concern and it should clear up in a day or so.  SYMPTOMS TO REPORT IMMEDIATELY:   Following lower endoscopy (colonoscopy or flexible sigmoidoscopy):  Excessive amounts of blood in the stool  Significant tenderness or worsening of abdominal pains  Swelling of the abdomen that is new, acute  Fever of 100F or higher  For urgent or emergent issues, a gastroenterologist can be reached at any hour by calling [phone]. Do not use MyChart messaging for urgent concerns.    DIET:  We do recommend a small meal at first, but then you may proceed to your regular diet.  Drink plenty of fluids but you should avoid alcoholic beverages for 24 hours.  ACTIVITY:  You should plan to take it easy for the rest of today and you should  NOT DRIVE or use heavy machinery until tomorrow (because of the sedation medicines used during the test).    FOLLOW UP: Our staff will call the number listed on your records 48-72 hours following your procedure to check on you and address any questions or concerns that you may have regarding the information given to you following your procedure. If we do not reach you, we will leave a message.  We will attempt to reach you two times.  During this call, we will ask if you have developed any symptoms of COVID 19. If you develop any symptoms (ie: fever, flu-like symptoms, shortness of breath, cough etc.) before then, please call [phone].  If you test positive for Covid 19 in the 2 weeks post procedure, please call and report this information to us.    If any biopsies were taken you will be contacted by phone or by letter within the next 1-3 weeks.  Please call us at [phone] if you have not heard about the biopsies in 3 weeks.    SIGNATURES/CONFIDENTIALITY: You and/or your care partner have signed paperwork which will be entered into your electronic medical record.  These signatures attest to the fact that that the information above on your After Visit Summary has been reviewed and is understood.  Full responsibility of the confidentiality of this discharge information lies with you and/or your care-partner. 

## 2019-12-20 NOTE — Progress Notes (Signed)
PT taken to PACU. Monitors in place. VSS. Report given to RN. 

## 2019-12-20 NOTE — Progress Notes (Signed)
Temp by JB Vitals by CW  Pt's states no medical or surgical changes since previsit or office visit.  

## 2019-12-22 ENCOUNTER — Telehealth: Payer: Self-pay

## 2019-12-22 NOTE — Telephone Encounter (Signed)
  Follow up Call-  Call back number 12/20/2019  Post procedure Call Back phone  # [phone]  Permission to leave phone message Yes  Some recent data might be hidden     Patient questions:  Do you have a fever, pain , or abdominal swelling? No. Pain Score  0 *  Have you tolerated food without any problems? Yes.    Have you been able to return to your normal activities? Yes.    Do you have any questions about your discharge instructions: Diet   No. Medications  No. Follow up visit  No.  Do you have questions or concerns about your Care? No.  Actions: * If pain score is 4 or above: No action needed, pain <4.  1. Have you developed a fever since your procedure? No  2.   Have you had an respiratory symptoms (SOB or cough) since your procedure? No 3.   Have you tested positive for COVID 19 since your procedure No  4.   Have you had any family members/close contacts diagnosed with the COVID 19 since your procedure?  No   If yes to any of these questions please route to Joylene John, RN and Erenest Rasher, RN

## 2019-12-26 ENCOUNTER — Encounter: Payer: Self-pay | Admitting: Gastroenterology

## 2020-06-28 ENCOUNTER — Other Ambulatory Visit: Payer: Self-pay | Admitting: Internal Medicine

## 2020-06-28 DIAGNOSIS — Z1231 Encounter for screening mammogram for malignant neoplasm of breast: Secondary | ICD-10-CM

## 2020-07-24 ENCOUNTER — Other Ambulatory Visit: Payer: Self-pay | Admitting: Internal Medicine

## 2020-07-24 DIAGNOSIS — N644 Mastodynia: Secondary | ICD-10-CM

## 2020-07-29 ENCOUNTER — Ambulatory Visit
Admission: RE | Admit: 2020-07-29 | Discharge: 2020-07-29 | Disposition: A | Discharge status: Home / Self Care | Source: Ambulatory Visit | Attending: Internal Medicine | Admitting: Internal Medicine

## 2020-07-29 ENCOUNTER — Other Ambulatory Visit: Payer: Self-pay

## 2020-07-29 ENCOUNTER — Ambulatory Visit: Admission: RE | Admit: 2020-07-29 | Source: Ambulatory Visit

## 2020-07-29 DIAGNOSIS — N644 Mastodynia: Secondary | ICD-10-CM

## 2020-07-31 ENCOUNTER — Ambulatory Visit

## 2020-07-31 ENCOUNTER — Other Ambulatory Visit: Payer: Self-pay

## 2020-07-31 ENCOUNTER — Ambulatory Visit
Admission: RE | Admit: 2020-07-31 | Discharge: 2020-07-31 | Disposition: A | Discharge status: Home / Self Care | Source: Ambulatory Visit | Attending: Internal Medicine | Admitting: Internal Medicine

## 2020-08-07 ENCOUNTER — Encounter

## 2020-08-07 ENCOUNTER — Other Ambulatory Visit

## 2021-05-14 ENCOUNTER — Other Ambulatory Visit: Payer: Self-pay | Admitting: Internal Medicine

## 2021-05-14 DIAGNOSIS — E2839 Other primary ovarian failure: Secondary | ICD-10-CM

## 2021-06-18 ENCOUNTER — Other Ambulatory Visit: Payer: Self-pay | Admitting: Internal Medicine

## 2021-06-18 DIAGNOSIS — Z1231 Encounter for screening mammogram for malignant neoplasm of breast: Secondary | ICD-10-CM

## 2021-08-04 ENCOUNTER — Other Ambulatory Visit: Payer: Self-pay

## 2021-08-04 ENCOUNTER — Ambulatory Visit
Admission: RE | Admit: 2021-08-04 | Discharge: 2021-08-04 | Disposition: A | Discharge status: Home / Self Care | Payer: Medicare Other | Source: Ambulatory Visit | Attending: Internal Medicine | Admitting: Internal Medicine

## 2021-08-04 DIAGNOSIS — Z1231 Encounter for screening mammogram for malignant neoplasm of breast: Secondary | ICD-10-CM

## 2021-09-26 ENCOUNTER — Ambulatory Visit
Admission: RE | Admit: 2021-09-26 | Discharge: 2021-09-26 | Disposition: A | Discharge status: Home / Self Care | Payer: Medicare Other | Source: Ambulatory Visit | Attending: Internal Medicine | Admitting: Internal Medicine

## 2021-09-26 ENCOUNTER — Other Ambulatory Visit: Payer: Self-pay

## 2021-09-26 DIAGNOSIS — E2839 Other primary ovarian failure: Secondary | ICD-10-CM

## 2021-10-31 ENCOUNTER — Other Ambulatory Visit

## 2022-03-13 ENCOUNTER — Other Ambulatory Visit: Payer: Self-pay | Admitting: Internal Medicine

## 2022-03-14 LAB — LIPID PANEL
Cholesterol: 158 mg/dL (ref <200)
HDL: 48 mg/dL — ABNORMAL LOW (ref >=50)
LDL Cholesterol (Calc): 96 mg/dL (calc)
Non-HDL Cholesterol (Calc): 110 mg/dL (calc) (ref <130)
Total CHOL/HDL Ratio: 3.3 (calc) (ref <5.0)
Triglycerides: 54 mg/dL (ref <150)

## 2022-03-14 LAB — COMPLETE METABOLIC PANEL WITH GFR
AG Ratio: 1.6 (calc) (ref 1.0–2.5)
ALT: 14 U/L (ref 6–29)
AST: 17 U/L (ref 10–35)
Albumin: 4.4 g/dL (ref 3.6–5.1)
Alkaline phosphatase (APISO): 57 U/L (ref 37–153)
BUN: 12 mg/dL (ref 7–25)
CO2: 26 mmol/L (ref 20–32)
Calcium: 10.2 mg/dL (ref 8.6–10.4)
Chloride: 104 mmol/L (ref 98–110)
Creat: 0.95 mg/dL (ref 0.50–1.05)
Globulin: 2.7 g/dL (calc) (ref 1.9–3.7)
Glucose, Bld: 133 mg/dL — ABNORMAL HIGH (ref 65–99)
Potassium: 4.6 mmol/L (ref 3.5–5.3)
Sodium: 140 mmol/L (ref 135–146)
Total Bilirubin: 0.4 mg/dL (ref 0.2–1.2)
Total Protein: 7.1 g/dL (ref 6.1–8.1)
eGFR: 66 mL/min/{1.73_m2} (ref >=60)

## 2022-03-14 LAB — CBC
HCT: 40.4 % (ref 35.0–45.0)
Hemoglobin: 13.3 g/dL (ref 11.7–15.5)
MCH: 26.9 pg — ABNORMAL LOW (ref 27.0–33.0)
MCHC: 32.9 g/dL (ref 32.0–36.0)
MCV: 81.8 fL (ref 80.0–100.0)
MPV: 10.2 fL (ref 7.5–12.5)
Platelets: 279 10*3/uL (ref 140–400)
RBC: 4.94 10*6/uL (ref 3.80–5.10)
RDW: 13.2 % (ref 11.0–15.0)
WBC: 5.5 10*3/uL (ref 3.8–10.8)

## 2022-03-14 LAB — VITAMIN D 25 HYDROXY (VIT D DEFICIENCY, FRACTURES): Vit D, 25-Hydroxy: 36 ng/mL (ref 30–100)

## 2022-03-14 LAB — TSH: TSH: 1.14 mIU/L (ref 0.40–4.50)

## 2022-07-06 ENCOUNTER — Other Ambulatory Visit: Payer: Self-pay | Admitting: Internal Medicine

## 2022-07-06 DIAGNOSIS — Z139 Encounter for screening, unspecified: Secondary | ICD-10-CM

## 2022-09-03 ENCOUNTER — Ambulatory Visit
Admission: RE | Admit: 2022-09-03 | Discharge: 2022-09-03 | Disposition: A | Discharge status: Home / Self Care | Payer: Medicare Other | Source: Ambulatory Visit | Attending: Internal Medicine | Admitting: Internal Medicine

## 2022-09-03 DIAGNOSIS — Z139 Encounter for screening, unspecified: Secondary | ICD-10-CM

## 2022-10-19 ENCOUNTER — Emergency Department (HOSPITAL_COMMUNITY)
Admission: EM | Admit: 2022-10-19 | Discharge: 2022-10-19 | Disposition: A | Discharge status: Home / Self Care | Payer: Medicare Other | Attending: Emergency Medicine | Admitting: Emergency Medicine

## 2022-10-19 ENCOUNTER — Other Ambulatory Visit: Payer: Self-pay

## 2022-10-19 ENCOUNTER — Encounter (HOSPITAL_COMMUNITY): Payer: Self-pay | Admitting: Emergency Medicine

## 2022-10-19 DIAGNOSIS — Z7982 Long term (current) use of aspirin: Secondary | ICD-10-CM | POA: Insufficient documentation

## 2022-10-19 DIAGNOSIS — I10 Essential (primary) hypertension: Secondary | ICD-10-CM | POA: Diagnosis not present

## 2022-10-19 DIAGNOSIS — R040 Epistaxis: Secondary | ICD-10-CM | POA: Insufficient documentation

## 2022-10-19 DIAGNOSIS — E119 Type 2 diabetes mellitus without complications: Secondary | ICD-10-CM | POA: Diagnosis not present

## 2022-10-19 DIAGNOSIS — Z7984 Long term (current) use of oral hypoglycemic drugs: Secondary | ICD-10-CM | POA: Insufficient documentation

## 2022-10-19 MED ORDER — OXYMETAZOLINE HCL 0.05 % NA SOLN
2.0000 | Freq: Once | NASAL | Status: DC
  Filled 2022-10-19: qty 30

## 2022-10-19 NOTE — ED Triage Notes (Signed)
BIB EMS for Nose bleed x 2 hours. Took BP meds this morning. Denies headache. Denies any pain.   EMS Vs BP 220/110 HR 110  98%  CBG 204  Afrin given at 1208

## 2022-10-19 NOTE — ED Provider Triage Note (Signed)
Emergency Medicine Provider Triage Evaluation Note  Alexis Shields , a 67 y.o. female  was evaluated in triage.  Pt complains of nose bleeding since 11 AM today.  She was given nasal spray at home and with EMS.  States she feels blood running in the back of her throat.  Patient has been holding pressure.  Symptom has improved..  Review of Systems  Positive: As above Negative: As above  Physical Exam  BP (!) 172/107   Pulse (!) 102   Temp 98 F (36.7 C) (Oral)   Resp 18   Wt 80.7 kg   SpO2 96%   BMI 29.61 kg/m  Gen:   Awake, no distress   Resp:  Normal effort  MSK:   Moves extremities without difficulty  Other:    Medical Decision Making  Medically screening exam initiated at 1:02 PM.  Appropriate orders placed.  Alexis Shields was informed that the remainder of the evaluation will be completed by another provider, this initial triage assessment does not replace that evaluation, and the importance of remaining in the ED until their evaluation is complete.     Rex Kras, [place] 10/19/22 2333

## 2022-10-19 NOTE — ED Provider Notes (Signed)
[place] AT [facility] Provider Note   CSN: [id number] Arrival date & time: 10/19/22  1244     History  Chief Complaint  Patient presents with   Epistaxis    Alexis Shields is a 67 y.o. female.  Patient is a 67 year old female with a history of hypertension, diabetes and hyperlipidemia who is presenting today for a nosebleed.  She reports that it started around 2 AM this morning and has been bleeding pretty persistently.  She feels that it is coming out both sides and down her throat but a little bit worse on the left.  She reports that they used Afrin with fire and then twice with EMS without any help in the bleeding and she reports it continues to get on the back of her throat.  She does take aspirin but denies any other anticoagulation.  No history of nosebleeds.  She denies recent congestion or a lot of nose blowing.  The history is provided by the patient.  Epistaxis      Home Medications Prior to Admission medications   Medication Sig Start Date End Date Taking? Authorizing Provider  aspirin EC 81 MG tablet Take 81 mg by mouth daily.    [provider]  atorvastatin (LIPITOR) 40 MG tablet Take 40 mg by mouth daily. 11/07/19   [provider]  FREESTYLE LITE test strip  08/25/19   [provider]  GLIPIZIDE XL 10 MG 24 hr tablet Take 10 mg by mouth daily. 10/17/19   [provider]  Lancets (FREESTYLE) lancets 1 each daily. 08/25/19   [provider]  losartan (COZAAR) 50 MG tablet TAKE 1 TABLET(50 MG) BY MOUTH DAILY 05/04/19   [name], MD  metFORMIN (GLUCOPHAGE) 500 MG tablet Take 1 tablet (500 mg total) by mouth 2 (two) times daily with a meal. 12/21/18   [name], MD      Allergies    Fexofenadine hcl, Influenza vaccines, Lisinopril, and Statins    Review of Systems   Review of Systems  HENT:  Positive for nosebleeds.     Physical Exam Updated Vital Signs BP (!)  172/107   Pulse (!) 102   Temp 98 F (36.7 C) (Oral)   Resp 18   Wt 80.7 kg   SpO2 96%   BMI 29.61 kg/m  Physical Exam Vitals and nursing note reviewed.  Constitutional:      General: She is not in acute distress.    Appearance: She is well-developed.  HENT:     Head: Normocephalic and atraumatic.     Nose:     Right Nostril: Epistaxis present.     Mouth/Throat:     Comments: Blood in the posterior pharynx Eyes:     Pupils: Pupils are equal, round, and reactive to light.  Cardiovascular:     Rate and Rhythm: Normal rate and regular rhythm.     Heart sounds: Normal heart sounds. No murmur heard.    No friction rub.  Pulmonary:     Effort: Pulmonary effort is normal. No respiratory distress.     Breath sounds: Normal breath sounds.  Musculoskeletal:        General: No tenderness. Normal range of motion.     Comments: No edema  Skin:    General: Skin is warm and dry.     Findings: No rash.  Neurological:     Mental Status: She is alert and oriented to person, place, and time. Mental  status is at baseline.     Cranial Nerves: No cranial nerve deficit.  Psychiatric:        Behavior: Behavior normal.     ED Results / Procedures / Treatments   Labs (all labs ordered are listed, but only abnormal results are displayed) Labs Reviewed - No data to display  EKG None  Radiology No results found.  Procedures .Epistaxis Management  Date/Time: 10/19/2022 3:32 PM  Performed by: Blanchie Dessert, MD Authorized by: Blanchie Dessert, MD   Consent:    Consent obtained:  Verbal   Consent given by:  Patient   Risks discussed:  Bleeding and pain   Alternatives discussed:  Alternative treatment Universal protocol:    Procedure explained and questions answered to patient or proxy's satisfaction: yes     Patient identity confirmed:  Verbally with patient Anesthesia:    Anesthesia method:  None Procedure details:    Treatment site:  R anterior   Treatment method:   Anterior pack   Treatment complexity:  Limited   Treatment episode: initial   Post-procedure details:    Assessment:  Bleeding stopped   Procedure completion:  Tolerated     Medications Ordered in ED Medications  oxymetazoline (AFRIN) 0.05 % nasal spray 2 spray (0 sprays Each Nare Not Given 10/19/22 1415)    ED Course/ Medical Decision Making/ A&P                             Medical Decision Making  Patient presenting today with at beast axis since 2 AM this morning.  Appears to be anterior from the left nare.  No obvious source of bleed is identified on exam.  Patient takes aspirin but no other anticoagulation.  No history of nosebleeds.  Had tried Afrin with fire and EMS but no improvement in the bleed.  Discussed with patient trying Afrin, pressure or TXA versus packing and patient requests packing given the ongoing bleeding.  Packing was placed without any difficulty.  After 1 hour patient has had no recurrence of bleed.  Patient is hypertensive here however blood pressure yesterday was in the 130s.  Most likely related to the situation.  She will continue her blood pressure medications and recheck tomorrow.  Follow-up with ENT in 1 week for packing removal.        Final Clinical Impression(s) / ED Diagnoses Final diagnoses:  Left-sided epistaxis    Rx / DC Orders ED Discharge Orders     None         Blanchie Dessert, MD 10/19/22 1534

## 2022-10-19 NOTE — Discharge Instructions (Signed)
Put some saline at the end of the packing several times a day to keep it moist you can put Vaseline on the other side of your nose to keep it moist.  Avoid blowing the nose.  Continue taking your current blood pressure medications and recheck the blood pressure tomorrow.

## 2022-11-20 ENCOUNTER — Emergency Department (HOSPITAL_BASED_OUTPATIENT_CLINIC_OR_DEPARTMENT_OTHER)
Admission: EM | Admit: 2022-11-20 | Discharge: 2022-11-20 | Disposition: A | Discharge status: Home / Self Care | Payer: Medicare Other | Attending: Emergency Medicine | Admitting: Emergency Medicine

## 2022-11-20 ENCOUNTER — Other Ambulatory Visit: Payer: Self-pay

## 2022-11-20 ENCOUNTER — Encounter (HOSPITAL_BASED_OUTPATIENT_CLINIC_OR_DEPARTMENT_OTHER): Payer: Self-pay | Admitting: Emergency Medicine

## 2022-11-20 DIAGNOSIS — Z7984 Long term (current) use of oral hypoglycemic drugs: Secondary | ICD-10-CM | POA: Diagnosis not present

## 2022-11-20 DIAGNOSIS — Z7982 Long term (current) use of aspirin: Secondary | ICD-10-CM | POA: Diagnosis not present

## 2022-11-20 DIAGNOSIS — S91209A Unspecified open wound of unspecified toe(s) with damage to nail, initial encounter: Secondary | ICD-10-CM | POA: Diagnosis not present

## 2022-11-20 DIAGNOSIS — X58XXXA Exposure to other specified factors, initial encounter: Secondary | ICD-10-CM | POA: Insufficient documentation

## 2022-11-20 DIAGNOSIS — E119 Type 2 diabetes mellitus without complications: Secondary | ICD-10-CM | POA: Insufficient documentation

## 2022-11-20 DIAGNOSIS — S99929A Unspecified injury of unspecified foot, initial encounter: Secondary | ICD-10-CM | POA: Diagnosis present

## 2022-11-20 MED ORDER — CEPHALEXIN 500 MG PO CAPS: ORAL_CAPSULE | ORAL | 0 refills | Status: DC

## 2022-11-20 NOTE — ED Provider Notes (Signed)
[place] Provider Note   CSN: [id number] Arrival date & time: 11/20/22  1051     History  Chief Complaint  Patient presents with   Nail Problem    Alexis Shields is a 67 y.o. female.  67 yo F with a chief complaints of an injury to her toenail.  This actually happened a few days ago.  She was tried to take nail polish off of her nail and inadvertently tore the nail about halfway up.  She has been soaking it in Epsom salts and applying a Band-Aid and antibiotic ointment.  Her mother was concerned because she has a history of diabetes and told her she needed to come to the ED and get antibiotics.  She had some very sparse drainage this morning.  Had tried to apply some peroxide last night with some discomfort.        Home Medications Prior to Admission medications   Medication Sig Start Date End Date Taking? Authorizing Provider  cephALEXin (KEFLEX) 500 MG capsule 2 caps po bid x 7 days 11/20/22  Yes Deno Etienne, DO  aspirin EC 81 MG tablet Take 81 mg by mouth daily.    [provider]  atorvastatin (LIPITOR) 40 MG tablet Take 40 mg by mouth daily. 11/07/19   [provider]  FREESTYLE LITE test strip  08/25/19   [provider]  GLIPIZIDE XL 10 MG 24 hr tablet Take 10 mg by mouth daily. 10/17/19   [provider]  Lancets (FREESTYLE) lancets 1 each daily. 08/25/19   [provider]  losartan (COZAAR) 50 MG tablet TAKE 1 TABLET(50 MG) BY MOUTH DAILY 05/04/19   [name], MD  metFORMIN (GLUCOPHAGE) 500 MG tablet Take 1 tablet (500 mg total) by mouth 2 (two) times daily with a meal. 12/21/18   [name], MD      Allergies    Fexofenadine hcl, Influenza vaccines, Lisinopril, and Statins    Review of Systems   Review of Systems  Physical Exam Updated Vital Signs BP (!) 185/97 (BP Location: Right Arm)   Pulse 99   Temp 98.2 F (36.8 C) (Oral)   Resp 17   SpO2 99%  Physical  Exam Vitals and nursing note reviewed.  Constitutional:      General: She is not in acute distress.    Appearance: She is well-developed. She is not diaphoretic.  HENT:     Head: Normocephalic and atraumatic.  Eyes:     Pupils: Pupils are equal, round, and reactive to light.  Cardiovascular:     Rate and Rhythm: Normal rate and regular rhythm.     Heart sounds: No murmur heard.    No friction rub. No gallop.  Pulmonary:     Effort: Pulmonary effort is normal.     Breath sounds: No wheezing or rales.  Abdominal:     General: There is no distension.     Palpations: Abdomen is soft.     Tenderness: There is no abdominal tenderness.  Musculoskeletal:        General: No tenderness.     Cervical back: Normal range of motion and neck supple.     Comments: Patient is a toenail that is torn off about halfway up, spares the nailbed.  No subungual hematoma.  The area underneath is clean and dry, mildly erythematous without any appreciable drainage or fluctuance.  No induration.  Skin:    General: Skin is warm and  dry.  Neurological:     Mental Status: She is alert and oriented to person, place, and time.  Psychiatric:        Behavior: Behavior normal.     ED Results / Procedures / Treatments   Labs (all labs ordered are listed, but only abnormal results are displayed) Labs Reviewed - No data to display  EKG None  Radiology No results found.  Procedures Procedures    Medications Ordered in ED Medications - No data to display  ED Course/ Medical Decision Making/ A&P                             Medical Decision Making Risk Prescription drug management.   67 yo F with a chief complaints of tearing part of her toenail off.  This occurred a few days ago.  A family member told her that she should be worried about infection with her history of diabetes and she came here to get antibiotics.  Not clinically infected currently.  Patient is very apprehensive about the possibility  of it getting infected.  I discussed with her local wound care.  I will write a prescription for antibiotics and encouraged her to hold off on starting them for a day or 2 and see if things improve.  Will have her follow-up with her family doctor in the office.  11:14 AM:  I have discussed the diagnosis/risks/treatment options with the patient.  Evaluation and diagnostic testing in the emergency department does not suggest an emergent condition requiring admission or immediate intervention beyond what has been performed at this time.  They will follow up with PCP. We also discussed returning to the ED immediately if new or worsening sx occur. We discussed the sx which are most concerning (e.g., sudden worsening pain, fever, inability to tolerate by mouth) that necessitate immediate return. Medications administered to the patient during their visit and any new prescriptions provided to the patient are listed below.  Medications given during this visit Medications - No data to display   The patient appears reasonably screen and/or stabilized for discharge and I doubt any other medical condition or other [facility] requiring further screening, evaluation, or treatment in the ED at this time prior to discharge.          Final Clinical Impression(s) / ED Diagnoses Final diagnoses:  Avulsed toenail, initial encounter    Rx / DC Orders ED Discharge Orders          Ordered    cephALEXin (KEFLEX) 500 MG capsule        11/20/22 Tim[place]on, Benjamin Perez, DO 11/20/22 1114

## 2022-11-20 NOTE — ED Notes (Signed)
Discharge paperwork given and verbally understood. 

## 2022-11-20 NOTE — Discharge Instructions (Signed)
I have described you an antibiotic.  I am not sure that it is infected currently, you could wait a day or 2 and see if things are improving and if not then you can start the antibiotic or alternatively if you are concerned you can start today.  Please return for rapid spreading redness drainage or if you develop a fever.  I would have you clean the area with soap and water and then you can apply an antibiotic ointment or just Vaseline a couple times a day.  Please do not use peroxide.

## 2022-11-20 NOTE — ED Triage Notes (Signed)
Pt reports pulling her right 4th toenail off on Monday when removing nail polish. Pt reports hx of diabetes.

## 2023-03-29 ENCOUNTER — Other Ambulatory Visit: Payer: Self-pay | Admitting: Internal Medicine

## 2023-03-30 LAB — CBC
HCT: 39.2 % (ref 35.0–45.0)
Hemoglobin: 12.5 g/dL (ref 11.7–15.5)
MCH: 26.3 pg — ABNORMAL LOW (ref 27.0–33.0)
MCHC: 31.9 g/dL — ABNORMAL LOW (ref 32.0–36.0)
MCV: 82.4 fL (ref 80.0–100.0)
MPV: 10.5 fL (ref 7.5–12.5)
Platelets: 289 10*3/uL (ref 140–400)
RBC: 4.76 10*6/uL (ref 3.80–5.10)
RDW: 13.4 % (ref 11.0–15.0)
WBC: 4.5 10*3/uL (ref 3.8–10.8)

## 2023-03-30 LAB — COMPLETE METABOLIC PANEL WITH GFR
AG Ratio: 1.3 (calc) (ref 1.0–2.5)
ALT: 11 U/L (ref 6–29)
AST: 12 U/L (ref 10–35)
Albumin: 4 g/dL (ref 3.6–5.1)
Alkaline phosphatase (APISO): 59 U/L (ref 37–153)
BUN: 18 mg/dL (ref 7–25)
CO2: 23 mmol/L (ref 20–32)
Calcium: 10 mg/dL (ref 8.6–10.4)
Chloride: 105 mmol/L (ref 98–110)
Creat: 0.87 mg/dL (ref 0.50–1.05)
Globulin: 3.1 g/dL (calc) (ref 1.9–3.7)
Glucose, Bld: 205 mg/dL — ABNORMAL HIGH (ref 65–99)
Potassium: 4.3 mmol/L (ref 3.5–5.3)
Sodium: 139 mmol/L (ref 135–146)
Total Bilirubin: 0.3 mg/dL (ref 0.2–1.2)
Total Protein: 7.1 g/dL (ref 6.1–8.1)
eGFR: 73 mL/min/{1.73_m2} (ref >=60)

## 2023-03-30 LAB — VITAMIN D 25 HYDROXY (VIT D DEFICIENCY, FRACTURES): Vit D, 25-Hydroxy: 46 ng/mL (ref 30–100)

## 2023-03-30 LAB — LIPID PANEL
Cholesterol: 245 mg/dL — ABNORMAL HIGH (ref <200)
HDL: 61 mg/dL (ref >=50)
LDL Cholesterol (Calc): 167 mg/dL (calc) — ABNORMAL HIGH
Non-HDL Cholesterol (Calc): 184 mg/dL (calc) — ABNORMAL HIGH (ref <130)
Total CHOL/HDL Ratio: 4 (calc) (ref <5.0)
Triglycerides: 70 mg/dL (ref <150)

## 2023-03-30 LAB — TSH: TSH: 1.71 mIU/L (ref 0.40–4.50)

## 2023-07-09 ENCOUNTER — Other Ambulatory Visit: Payer: Self-pay

## 2023-07-09 ENCOUNTER — Emergency Department (HOSPITAL_BASED_OUTPATIENT_CLINIC_OR_DEPARTMENT_OTHER): Payer: Medicare Other

## 2023-07-09 ENCOUNTER — Emergency Department (HOSPITAL_BASED_OUTPATIENT_CLINIC_OR_DEPARTMENT_OTHER)
Admission: EM | Admit: 2023-07-09 | Discharge: 2023-07-09 | Disposition: A | Discharge status: Home / Self Care | Payer: Medicare Other | Attending: Emergency Medicine | Admitting: Emergency Medicine

## 2023-07-09 ENCOUNTER — Encounter (HOSPITAL_BASED_OUTPATIENT_CLINIC_OR_DEPARTMENT_OTHER): Payer: Self-pay | Admitting: Emergency Medicine

## 2023-07-09 DIAGNOSIS — R109 Unspecified abdominal pain: Secondary | ICD-10-CM | POA: Diagnosis present

## 2023-07-09 DIAGNOSIS — Z7982 Long term (current) use of aspirin: Secondary | ICD-10-CM | POA: Insufficient documentation

## 2023-07-09 DIAGNOSIS — E119 Type 2 diabetes mellitus without complications: Secondary | ICD-10-CM | POA: Insufficient documentation

## 2023-07-09 LAB — CBC WITH DIFFERENTIAL/PLATELET
Abs Immature Granulocytes: 0.01 10*3/uL (ref 0.00–0.07)
Basophils Absolute: 0 10*3/uL (ref 0.0–0.1)
Basophils Relative: 1 %
Eosinophils Absolute: 0.2 10*3/uL (ref 0.0–0.5)
Eosinophils Relative: 3 %
HCT: 39.8 % (ref 36.0–46.0)
Hemoglobin: 13 g/dL (ref 12.0–15.0)
Immature Granulocytes: 0 %
Lymphocytes Relative: 35 %
Lymphs Abs: 2.4 10*3/uL (ref 0.7–4.0)
MCH: 27 pg (ref 26.0–34.0)
MCHC: 32.7 g/dL (ref 30.0–36.0)
MCV: 82.7 fL (ref 80.0–100.0)
Monocytes Absolute: 0.5 10*3/uL (ref 0.1–1.0)
Monocytes Relative: 7 %
Neutro Abs: 3.9 10*3/uL (ref 1.7–7.7)
Neutrophils Relative %: 54 %
Platelets: 246 10*3/uL (ref 150–400)
RBC: 4.81 MIL/uL (ref 3.87–5.11)
RDW: 13.2 % (ref 11.5–15.5)
WBC: 7 10*3/uL (ref 4.0–10.5)
nRBC: 0 % (ref 0.0–0.2)

## 2023-07-09 LAB — COMPREHENSIVE METABOLIC PANEL
ALT: 13 U/L (ref 0–44)
AST: 18 U/L (ref 15–41)
Albumin: 4.6 g/dL (ref 3.5–5.0)
Alkaline Phosphatase: 45 U/L (ref 38–126)
Anion gap: 10 (ref 5–15)
BUN: 26 mg/dL — ABNORMAL HIGH (ref 8–23)
CO2: 25 mmol/L (ref 22–32)
Calcium: 10.4 mg/dL — ABNORMAL HIGH (ref 8.9–10.3)
Chloride: 102 mmol/L (ref 98–111)
Creatinine, Ser: 1.02 mg/dL — ABNORMAL HIGH (ref 0.44–1.00)
GFR, Estimated: >60 mL/min (ref >60)
Glucose, Bld: 153 mg/dL — ABNORMAL HIGH (ref 70–99)
Potassium: 4.5 mmol/L (ref 3.5–5.1)
Sodium: 137 mmol/L (ref 135–145)
Total Bilirubin: 0.4 mg/dL (ref 0.3–1.2)
Total Protein: 7.2 g/dL (ref 6.5–8.1)

## 2023-07-09 LAB — LIPASE, BLOOD: Lipase: 51 U/L (ref 11–51)

## 2023-07-09 MED ORDER — IOHEXOL 300 MG/ML  SOLN
100.0000 mL | Freq: Once | INTRAMUSCULAR | Status: AC | PRN
  Administered 2023-07-09: 100 mL via INTRAVENOUS

## 2023-07-09 MED ORDER — ONDANSETRON HCL 4 MG/2ML IJ SOLN
4.0000 mg | Freq: Once | INTRAMUSCULAR | Status: AC
  Administered 2023-07-09: 4 mg via INTRAVENOUS
  Filled 2023-07-09: qty 2

## 2023-07-09 MED ORDER — MORPHINE SULFATE (PF) 4 MG/ML IV SOLN
4.0000 mg | Freq: Once | INTRAVENOUS | Status: AC
  Administered 2023-07-09: 4 mg via INTRAVENOUS
  Filled 2023-07-09: qty 1

## 2023-07-09 NOTE — ED Provider Notes (Signed)
[place] EMERGENCY DEPARTMENT AT [facility] Provider Note   CSN: [id number] Arrival date & time: 07/09/23  1614     History {Add pertinent medical, surgical, social history, OB history to HPI:1} Chief Complaint  Patient presents with   Back Pain    Alexis Shields is a 67 y.o. female.  She has a history of diabetes and hypertension.  Complaining of right flank right upper quadrant pain that is been going on about 6 weeks.  She said it all started after she was lifting some cases of water.  She has been seen in urgent care and just saw orthopedics today.  They have set her up for an outpatient MRI of her spine along with given prescriptions for some pain medicine and steroids.  They did recommend she come here for further evaluation in case this is not spine related.  She denies any nausea or vomiting.  No urinary symptoms no fevers or chills.  Pain is mostly with twisting and turning although she does have pain at rest also.  Has been using Goody powders Tylenol ibuprofen without any improvement.  The history is provided by the patient.  Abdominal Pain Pain location:  RUQ and R flank Pain quality: aching   Pain radiates to:  Back and R leg Pain severity:  Moderate Onset quality:  Gradual Duration:  6 weeks Timing:  Intermittent Progression:  Worsening Chronicity:  New Relieved by:  Nothing Worsened by:  Movement Ineffective treatments:  NSAIDs Associated symptoms: no chest pain, no constipation, no cough, no diarrhea, no dysuria, no fever, no nausea and no vomiting        Home Medications Prior to Admission medications   Medication Sig Start Date End Date Taking? Authorizing Provider  aspirin EC 81 MG tablet Take 81 mg by mouth daily. Patient not taking: Reported on 07/09/2023    [provider]  atorvastatin (LIPITOR) 40 MG tablet Take 40 mg by mouth daily. 11/07/19   [provider]  cephALEXin (KEFLEX) 500 MG capsule 2 caps po bid x 7 days  11/20/22   Melene Plan, DO  FREESTYLE LITE test strip  08/25/19   [provider]  GLIPIZIDE XL 10 MG 24 hr tablet Take 10 mg by mouth daily. 10/17/19   [provider]  Lancets (FREESTYLE) lancets 1 each daily. 08/25/19   [provider]  losartan (COZAAR) 50 MG tablet TAKE 1 TABLET(50 MG) BY MOUTH DAILY 05/04/19   Doristine Bosworth, MD  metFORMIN (GLUCOPHAGE) 500 MG tablet Take 1 tablet (500 mg total) by mouth 2 (two) times daily with a meal. 12/21/18   Doristine Bosworth, MD      Allergies    Fexofenadine hcl, Influenza vaccines, Lisinopril, and Statins    Review of Systems   Review of Systems  Constitutional:  Negative for fever.  Respiratory:  Negative for cough.   Cardiovascular:  Negative for chest pain.  Gastrointestinal:  Positive for abdominal pain. Negative for constipation, diarrhea, nausea and vomiting.  Genitourinary:  Positive for flank pain. Negative for dysuria.  Musculoskeletal:  Positive for back pain.    Physical Exam Updated Vital Signs BP (!) 154/90   Pulse 97   Temp 98.4 F (36.9 C) (Oral)   Resp 16   Ht 5\' 5"  (1.651 m)   Wt 78.5 kg   SpO2 100%   BMI 28.79 kg/m  Physical Exam Vitals and nursing note reviewed.  Constitutional:      General: She is not in  acute distress.    Appearance: Normal appearance. She is well-developed.  HENT:     Head: Normocephalic and atraumatic.  Eyes:     Conjunctiva/sclera: Conjunctivae normal.  Cardiovascular:     Rate and Rhythm: Normal rate and regular rhythm.     Heart sounds: No murmur heard. Pulmonary:     Effort: Pulmonary effort is normal. No respiratory distress.     Breath sounds: Normal breath sounds.  Abdominal:     Palpations: Abdomen is soft.     Tenderness: There is abdominal tenderness (ruq). There is no guarding or rebound.  Musculoskeletal:        General: No deformity. Normal range of motion.     Cervical back: Neck supple.  Skin:    General: Skin is warm and dry.      Capillary Refill: Capillary refill takes less than 2 seconds.  Neurological:     General: No focal deficit present.     Mental Status: She is alert.     Sensory: No sensory deficit.     Motor: No weakness.     ED Results / Procedures / Treatments   Labs (all labs ordered are listed, but only abnormal results are displayed) Labs Reviewed - No data to display  EKG None  Radiology No results found.  Procedures Procedures  {Document cardiac monitor, telemetry assessment procedure when appropriate:1}  Medications Ordered in ED Medications  ondansetron (ZOFRAN) injection 4 mg (has no administration in time range)  morphine (PF) 4 MG/ML injection 4 mg (has no administration in time range)    ED Course/ Medical Decision Making/ A&P   {   Click here for ABCD2, HEART and other calculatorsREFRESH Note before signing :1}                              Medical Decision Making Amount and/or Complexity of Data Reviewed Labs: ordered. Radiology: ordered.  Risk Prescription drug management.   This patient complains of ***; this involves an extensive number of treatment Options and is a complaint that carries with it a high risk of complications and morbidity. The differential includes ***  I ordered, reviewed and interpreted labs, which included *** I ordered medication *** and reviewed PMP when indicated. I ordered imaging studies which included *** and I independently    visualized and interpreted imaging which showed *** Additional history obtained from *** Previous records obtained and reviewed *** I consulted *** and discussed lab and imaging findings and discussed disposition.  Cardiac monitoring reviewed, *** Social determinants considered, *** Critical Interventions: ***  After the interventions stated above, I reevaluated the patient and found *** Admission and further testing considered, ***   {Document critical care time when appropriate:1} {Document review of  labs and clinical decision tools ie heart score, Chads2Vasc2 etc:1}  {Document your independent review of radiology images, and any outside records:1} {Document your discussion with family members, caretakers, and with consultants:1} {Document social determinants of health affecting pt's care:1} {Document your decision making why or why not admission, treatments were needed:1} Final Clinical Impression(s) / ED Diagnoses Final diagnoses:  None    Rx / DC Orders ED Discharge Orders     None

## 2023-07-09 NOTE — Discharge Instructions (Signed)
You were seen in the emergency department for continued right flank right upper quadrant right back pain.  You had blood work urinalysis CAT scan and ultrasound that did not show an obvious explanation for your symptoms.  There was no evidence of gallstones or kidney stone that could be causing your symptoms.  Please follow-up with your primary care doctor and your spine doctor.  Use the medication that they prescribed you.  Follow-up with your MRI as scheduled.  Return if any worsening or concerning symptoms

## 2023-07-09 NOTE — ED Triage Notes (Signed)
Right flank and lower back pain since Sept 2024. Started after lifting so cases of water. Pain radiating into right hip and leg. No loss of control of bowel/bladder. No urinary changes. No changes to pain recently- has just progressed and too much to tolerate. Has MRI scheduled for next week. Sent for possible scan today.

## 2023-07-23 ENCOUNTER — Other Ambulatory Visit: Payer: Self-pay | Admitting: Orthopedic Surgery

## 2023-07-23 DIAGNOSIS — M546 Pain in thoracic spine: Secondary | ICD-10-CM

## 2023-08-03 NOTE — Discharge Instructions (Signed)

## 2023-08-04 ENCOUNTER — Ambulatory Visit
Admission: RE | Admit: 2023-08-04 | Discharge: 2023-08-04 | Disposition: A | Discharge status: Home / Self Care | Payer: Medicare Other | Source: Ambulatory Visit | Attending: Orthopedic Surgery | Admitting: Orthopedic Surgery

## 2023-08-04 ENCOUNTER — Other Ambulatory Visit: Payer: Self-pay | Admitting: Orthopedic Surgery

## 2023-08-04 ENCOUNTER — Inpatient Hospital Stay
Admission: RE | Admit: 2023-08-04 | Discharge: 2023-08-04 | Disposition: A | Discharge status: Home / Self Care | Payer: Medicare Other | Source: Ambulatory Visit | Attending: Orthopedic Surgery | Admitting: Orthopedic Surgery

## 2023-08-04 DIAGNOSIS — M546 Pain in thoracic spine: Secondary | ICD-10-CM

## 2023-08-04 MED ORDER — IOPAMIDOL (ISOVUE-M 300) INJECTION 61%
1.0000 mL | Freq: Once | INTRAMUSCULAR | Status: AC | PRN
  Administered 2023-08-04: 1 mL via EPIDURAL

## 2023-08-04 MED ORDER — TRIAMCINOLONE ACETONIDE 40 MG/ML IJ SUSP (RADIOLOGY)
60.0000 mg | Freq: Once | INTRAMUSCULAR | Status: AC
  Administered 2023-08-04: 60 mg via EPIDURAL

## 2023-08-04 NOTE — Discharge Instructions (Signed)

## 2023-11-02 ENCOUNTER — Other Ambulatory Visit: Payer: Self-pay | Admitting: Internal Medicine

## 2023-11-02 DIAGNOSIS — Z1231 Encounter for screening mammogram for malignant neoplasm of breast: Secondary | ICD-10-CM

## 2023-11-09 ENCOUNTER — Ambulatory Visit
Admission: RE | Admit: 2023-11-09 | Discharge: 2023-11-09 | Disposition: A | Discharge status: Home / Self Care | Payer: Medicare Other | Source: Ambulatory Visit | Attending: Internal Medicine | Admitting: Internal Medicine

## 2023-11-09 DIAGNOSIS — Z1231 Encounter for screening mammogram for malignant neoplasm of breast: Secondary | ICD-10-CM

## 2023-11-15 ENCOUNTER — Other Ambulatory Visit: Payer: Self-pay | Admitting: Internal Medicine

## 2023-11-15 DIAGNOSIS — R928 Other abnormal and inconclusive findings on diagnostic imaging of breast: Secondary | ICD-10-CM

## 2023-11-16 ENCOUNTER — Ambulatory Visit
Admission: RE | Admit: 2023-11-16 | Discharge: 2023-11-16 | Disposition: A | Discharge status: Home / Self Care | Source: Ambulatory Visit | Attending: Internal Medicine | Admitting: Internal Medicine

## 2023-11-16 DIAGNOSIS — R928 Other abnormal and inconclusive findings on diagnostic imaging of breast: Secondary | ICD-10-CM

## 2023-11-26 ENCOUNTER — Encounter

## 2023-11-26 ENCOUNTER — Other Ambulatory Visit

## 2024-05-07 ENCOUNTER — Emergency Department (HOSPITAL_COMMUNITY)
Admission: EM | Admit: 2024-05-07 | Discharge: 2024-05-08 | Disposition: A | Discharge status: Home / Self Care | Attending: Emergency Medicine | Admitting: Emergency Medicine

## 2024-05-07 ENCOUNTER — Emergency Department (HOSPITAL_COMMUNITY)

## 2024-05-07 ENCOUNTER — Other Ambulatory Visit: Payer: Self-pay

## 2024-05-07 DIAGNOSIS — E876 Hypokalemia: Secondary | ICD-10-CM | POA: Insufficient documentation

## 2024-05-07 DIAGNOSIS — E871 Hypo-osmolality and hyponatremia: Secondary | ICD-10-CM | POA: Insufficient documentation

## 2024-05-07 DIAGNOSIS — R0789 Other chest pain: Secondary | ICD-10-CM

## 2024-05-07 DIAGNOSIS — I10 Essential (primary) hypertension: Secondary | ICD-10-CM | POA: Insufficient documentation

## 2024-05-07 LAB — CBC
HCT: 39.3 % (ref 36.0–46.0)
Hemoglobin: 12.9 g/dL (ref 12.0–15.0)
MCH: 26.8 pg (ref 26.0–34.0)
MCHC: 32.8 g/dL (ref 30.0–36.0)
MCV: 81.7 fL (ref 80.0–100.0)
Platelets: 285 K/uL (ref 150–400)
RBC: 4.81 MIL/uL (ref 3.87–5.11)
RDW: 13.3 % (ref 11.5–15.5)
WBC: 6.6 K/uL (ref 4.0–10.5)
nRBC: 0 % (ref 0.0–0.2)

## 2024-05-07 LAB — TROPONIN I (HIGH SENSITIVITY)
Troponin I (High Sensitivity): 3 ng/L (ref <18)
Troponin I (High Sensitivity): 3 ng/L (ref <18)

## 2024-05-07 LAB — BASIC METABOLIC PANEL WITH GFR
Anion gap: 13 (ref 5–15)
BUN: 10 mg/dL (ref 8–23)
CO2: 20 mmol/L — ABNORMAL LOW (ref 22–32)
Calcium: 10 mg/dL (ref 8.9–10.3)
Chloride: 101 mmol/L (ref 98–111)
Creatinine, Ser: 0.89 mg/dL (ref 0.44–1.00)
GFR, Estimated: >60 mL/min (ref >60)
Glucose, Bld: 168 mg/dL — ABNORMAL HIGH (ref 70–99)
Potassium: 3.2 mmol/L — ABNORMAL LOW (ref 3.5–5.1)
Sodium: 134 mmol/L — ABNORMAL LOW (ref 135–145)

## 2024-05-07 NOTE — ED Provider Notes (Incomplete)
 [place] EMERGENCY DEPARTMENT AT [facility] Provider Note   CSN: [id number] Arrival date & time: 05/07/24  2029     Patient presents with: Chest Pain   Alexis Shields is a 68 y.o. female.  {Add pertinent medical, surgical, social history, OB history to YEP:67052} Patient presents to the emergency department for evaluation of chest pain.  Patient denies cardiac history.  She does report that 20 or more years ago she felt similar pains and was told that it was severe indigestion.  She does not take any reflux medications currently.  Patient describes pain just under her left breast.  No radiation of the pain.  Mild shortness of breath.  No exertional changes.  No nausea or diaphoresis.  No abdominal pain.       Prior to Admission medications   Medication Sig Start Date End Date Taking? Authorizing Provider  aspirin EC 81 MG tablet Take 81 mg by mouth daily. Patient not taking: Reported on 07/09/2023    [provider]  atorvastatin  (LIPITOR) 40 MG tablet Take 40 mg by mouth daily. 11/07/19   [provider]  cephALEXin  (KEFLEX ) 500 MG capsule 2 caps po bid x 7 days 11/20/22   Emil Share, DO  FREESTYLE LITE test strip  08/25/19   [provider]  GLIPIZIDE XL 10 MG 24 hr tablet Take 10 mg by mouth daily. 10/17/19   [provider]  Lancets (FREESTYLE) lancets 1 each daily. 08/25/19   [provider]  losartan  (COZAAR ) 50 MG tablet TAKE 1 TABLET(50 MG) BY MOUTH DAILY 05/04/19   Stallings, Zoe A, MD  metFORMIN  (GLUCOPHAGE ) 500 MG tablet Take 1 tablet (500 mg total) by mouth 2 (two) times daily with a meal. 12/21/18   Bettie Santana LABOR, MD    Allergies: Fexofenadine hcl, Influenza vaccines, Lisinopril , and Statins    Review of Systems  Updated Vital Signs BP (!) 187/111 (BP Location: Right Arm)   Pulse (!) 106   Temp 98.5 F (36.9 C)   Resp 18   SpO2 98%   Physical Exam Vitals and nursing note reviewed.  Constitutional:       General: She is not in acute distress.    Appearance: She is well-developed.  HENT:     Head: Normocephalic and atraumatic.     Mouth/Throat:     Mouth: Mucous membranes are moist.  Eyes:     General: Vision grossly intact. Gaze aligned appropriately.     Extraocular Movements: Extraocular movements intact.     Conjunctiva/sclera: Conjunctivae normal.  Cardiovascular:     Rate and Rhythm: Normal rate and regular rhythm.     Pulses: Normal pulses.     Heart sounds: Normal heart sounds, S1 normal and S2 normal. No murmur heard.    No friction rub. No gallop.  Pulmonary:     Effort: Pulmonary effort is normal. No respiratory distress.     Breath sounds: Normal breath sounds.  Chest:    Abdominal:     General: Bowel sounds are normal.     Palpations: Abdomen is soft.     Tenderness: There is no abdominal tenderness. There is no guarding or rebound.     Hernia: No hernia is present.  Musculoskeletal:        General: No swelling.     Cervical back: Full passive range of motion without pain, normal range of motion and neck supple. No spinous process tenderness or muscular tenderness. Normal range of motion.  Right lower leg: No edema.     Left lower leg: No edema.  Skin:    General: Skin is warm and dry.     Capillary Refill: Capillary refill takes less than 2 seconds.     Findings: No ecchymosis, erythema, rash or wound.  Neurological:     General: No focal deficit present.     Mental Status: She is alert and oriented to person, place, and time.     GCS: GCS eye subscore is 4. GCS verbal subscore is 5. GCS motor subscore is 6.     Cranial Nerves: Cranial nerves 2-12 are intact.     Sensory: Sensation is intact.     Motor: Motor function is intact.     Coordination: Coordination is intact.  Psychiatric:        Attention and Perception: Attention normal.        Mood and Affect: Mood normal.        Speech: Speech normal.        Behavior: Behavior normal.     (all  labs ordered are listed, but only abnormal results are displayed) Labs Reviewed  BASIC METABOLIC PANEL WITH GFR - Abnormal; Notable for the following components:      Result Value   Sodium 134 (*)    Potassium 3.2 (*)    CO2 20 (*)    Glucose, Bld 168 (*)    All other components within normal limits  CBC  TROPONIN I (HIGH SENSITIVITY)  TROPONIN I (HIGH SENSITIVITY)    EKG: EKG Interpretation Date/Time:  Sunday May 07 2024 20:41:49 EDT Ventricular Rate:  98 PR Interval:  126 QRS Duration:  72 QT Interval:  344 QTC Calculation: 439 R Axis:   37  Text Interpretation: Normal sinus rhythm Nonspecific ST and T wave abnormality Abnormal ECG When compared with ECG of 17-May-2012 00:31, No significant change since last tracing Confirmed by Haze Lonni PARAS [phone]) on 05/07/2024 11:50:33 PM  Radiology: [facility] Chest 2 View Result Date: 05/07/2024 CLINICAL DATA:  Left chest pain EXAM: CHEST - 2 VIEW COMPARISON:  Chest x-ray 12/08/2017 FINDINGS: The heart size and mediastinal contours are within normal limits. Both lungs are clear. The visualized skeletal structures are unremarkable. IMPRESSION: No active cardiopulmonary disease. Electronically Signed   By: Greig Pique M.D.   On: 05/07/2024 20:57    {Document cardiac monitor, telemetry assessment procedure when appropriate:32947} Procedures   Medications Ordered in the ED - No data to display    {Click here for ABCD2, HEART and other calculators REFRESH Note before signing:1}                              Medical Decision Making Amount and/or Complexity of Data Reviewed Labs: ordered. Decision-making details documented in ED Course. Radiology: ordered and independent interpretation performed. Decision-making details documented in ED Course. ECG/medicine tests: ordered and independent interpretation performed. Decision-making details documented in ED Course.   Differential Diagnosis considered includes, but not limited  to: STEMI; NSTEMI; myocarditis; pericarditis; pulmonary embolism; aortic dissection; pneumothorax; pneumonia; gastritis; musculoskeletal pain  Presents emergency department for evaluation of left-sided chest pain.  Patient does have cardiac risk factors, specifically hypertension and diabetes.  No known CAD.  {Document critical care time when appropriate  Document review of labs and clinical decision tools ie CHADS2VASC2, etc  Document your independent review of radiology images and any outside records  Document your discussion with family members, caretakers and  with consultants  Document social determinants of health affecting pt's care  Document your decision making why or why not admission, treatments were needed:32947:::1}   Final diagnoses:  None    ED Discharge Orders     None

## 2024-05-07 NOTE — ED Provider Notes (Signed)
 [place] EMERGENCY DEPARTMENT AT [facility] Provider Note   CSN: [id number] Arrival date & time: 05/07/24  2029     Patient presents with: Chest Pain   Alexis Shields is a 68 y.o. female.  {Add pertinent medical, surgical, social history, OB history to YEP:67052} Patient presents to the emergency department for evaluation of chest pain.  Patient denies cardiac history.  She does report that 20 or more years ago she felt similar pains and was told that it was severe indigestion.  She does not take any reflux medications currently.  Patient describes pain just under her left breast.  No radiation of the pain.  Mild shortness of breath.  No exertional changes.  No nausea or diaphoresis.  No abdominal pain.       Prior to Admission medications   Medication Sig Start Date End Date Taking? Authorizing Provider  aspirin EC 81 MG tablet Take 81 mg by mouth daily. Patient not taking: Reported on 07/09/2023    [provider]  atorvastatin  (LIPITOR) 40 MG tablet Take 40 mg by mouth daily. 11/07/19   [provider]  cephALEXin  (KEFLEX ) 500 MG capsule 2 caps po bid x 7 days 11/20/22   Emil Share, DO  FREESTYLE LITE test strip  08/25/19   [provider]  GLIPIZIDE XL 10 MG 24 hr tablet Take 10 mg by mouth daily. 10/17/19   [provider]  Lancets (FREESTYLE) lancets 1 each daily. 08/25/19   [provider]  losartan  (COZAAR ) 50 MG tablet TAKE 1 TABLET(50 MG) BY MOUTH DAILY 05/04/19   Stallings, Zoe A, MD  metFORMIN  (GLUCOPHAGE ) 500 MG tablet Take 1 tablet (500 mg total) by mouth 2 (two) times daily with a meal. 12/21/18   Bettie Santana LABOR, MD    Allergies: Fexofenadine hcl, Influenza vaccines, Lisinopril , and Statins    Review of Systems  Updated Vital Signs BP (!) 187/111 (BP Location: Right Arm)   Pulse (!) 106   Temp 98.5 F (36.9 C)   Resp 18   SpO2 98%   Physical Exam Vitals and nursing note reviewed.  Constitutional:       General: She is not in acute distress.    Appearance: She is well-developed.  HENT:     Head: Normocephalic and atraumatic.     Mouth/Throat:     Mouth: Mucous membranes are moist.  Eyes:     General: Vision grossly intact. Gaze aligned appropriately.     Extraocular Movements: Extraocular movements intact.     Conjunctiva/sclera: Conjunctivae normal.  Cardiovascular:     Rate and Rhythm: Normal rate and regular rhythm.     Pulses: Normal pulses.     Heart sounds: Normal heart sounds, S1 normal and S2 normal. No murmur heard.    No friction rub. No gallop.  Pulmonary:     Effort: Pulmonary effort is normal. No respiratory distress.     Breath sounds: Normal breath sounds.  Chest:    Abdominal:     General: Bowel sounds are normal.     Palpations: Abdomen is soft.     Tenderness: There is no abdominal tenderness. There is no guarding or rebound.     Hernia: No hernia is present.  Musculoskeletal:        General: No swelling.     Cervical back: Full passive range of motion without pain, normal range of motion and neck supple. No spinous process tenderness or muscular tenderness. Normal range of motion.  Right lower leg: No edema.     Left lower leg: No edema.  Skin:    General: Skin is warm and dry.     Capillary Refill: Capillary refill takes less than 2 seconds.     Findings: No ecchymosis, erythema, rash or wound.  Neurological:     General: No focal deficit present.     Mental Status: She is alert and oriented to person, place, and time.     GCS: GCS eye subscore is 4. GCS verbal subscore is 5. GCS motor subscore is 6.     Cranial Nerves: Cranial nerves 2-12 are intact.     Sensory: Sensation is intact.     Motor: Motor function is intact.     Coordination: Coordination is intact.  Psychiatric:        Attention and Perception: Attention normal.        Mood and Affect: Mood normal.        Speech: Speech normal.        Behavior: Behavior normal.     (all  labs ordered are listed, but only abnormal results are displayed) Labs Reviewed  BASIC METABOLIC PANEL WITH GFR - Abnormal; Notable for the following components:      Result Value   Sodium 134 (*)    Potassium 3.2 (*)    CO2 20 (*)    Glucose, Bld 168 (*)    All other components within normal limits  CBC  TROPONIN I (HIGH SENSITIVITY)  TROPONIN I (HIGH SENSITIVITY)    EKG: EKG Interpretation Date/Time:  Sunday May 07 2024 20:41:49 EDT Ventricular Rate:  98 PR Interval:  126 QRS Duration:  72 QT Interval:  344 QTC Calculation: 439 R Axis:   37  Text Interpretation: Normal sinus rhythm Nonspecific ST and T wave abnormality Abnormal ECG When compared with ECG of 17-May-2012 00:31, No significant change since last tracing Confirmed by Haze Lonni PARAS [phone]) on 05/07/2024 11:50:33 PM  Radiology: [facility] Chest 2 View Result Date: 05/07/2024 CLINICAL DATA:  Left chest pain EXAM: CHEST - 2 VIEW COMPARISON:  Chest x-ray 12/08/2017 FINDINGS: The heart size and mediastinal contours are within normal limits. Both lungs are clear. The visualized skeletal structures are unremarkable. IMPRESSION: No active cardiopulmonary disease. Electronically Signed   By: Greig Pique M.D.   On: 05/07/2024 20:57    {Document cardiac monitor, telemetry assessment procedure when appropriate:32947} Procedures   Medications Ordered in the ED - No data to display    {Click here for ABCD2, HEART and other calculators REFRESH Note before signing:1}                              Medical Decision Making Amount and/or Complexity of Data Reviewed Labs: ordered. Radiology: ordered.   ***  {Document critical care time when appropriate  Document review of labs and clinical decision tools ie CHADS2VASC2, etc  Document your independent review of radiology images and any outside records  Document your discussion with family members, caretakers and with consultants  Document social determinants of health  affecting pt's care  Document your decision making why or why not admission, treatments were needed:32947:::1}   Final diagnoses:  None    ED Discharge Orders     None

## 2024-05-07 NOTE — ED Triage Notes (Addendum)
 Patient reports intermittent left chest pain this week with mild SOB , no emesis or diaphoresis. Hypertensive at triage .

## 2024-05-08 ENCOUNTER — Emergency Department (HOSPITAL_COMMUNITY)

## 2024-05-08 DIAGNOSIS — I10 Essential (primary) hypertension: Secondary | ICD-10-CM | POA: Diagnosis not present

## 2024-05-08 MED ORDER — IOHEXOL 350 MG/ML SOLN
75.0000 mL | Freq: Once | INTRAVENOUS | Status: AC | PRN
  Administered 2024-05-08: 75 mL via INTRAVENOUS

## 2024-05-08 MED ORDER — PANTOPRAZOLE SODIUM 40 MG PO TBEC: 40.0000 mg | DELAYED_RELEASE_TABLET | Freq: Every day | ORAL | 3 refills | Status: DC

## 2024-05-08 MED ORDER — PANTOPRAZOLE SODIUM 40 MG PO TBEC: 40.0000 mg | DELAYED_RELEASE_TABLET | Freq: Every day | ORAL | 3 refills | Status: AC

## 2024-05-08 NOTE — Discharge Instructions (Addendum)
Take Tylenol as needed for the pain
